# Patient Record
Sex: Female | Born: 1960
Health system: Southern US, Community
[De-identification: ages and names within clinical notes are randomized; demographics above are authoritative.]

## PROBLEM LIST (undated history)

## (undated) DIAGNOSIS — T7840XA Allergy, unspecified, initial encounter: Secondary | ICD-10-CM

## (undated) DIAGNOSIS — E785 Hyperlipidemia, unspecified: Secondary | ICD-10-CM

## (undated) DIAGNOSIS — G4733 Obstructive sleep apnea (adult) (pediatric): Secondary | ICD-10-CM

## (undated) DIAGNOSIS — M5136 Other intervertebral disc degeneration, lumbar region: Secondary | ICD-10-CM

## (undated) DIAGNOSIS — L309 Dermatitis, unspecified: Secondary | ICD-10-CM

## (undated) DIAGNOSIS — K649 Unspecified hemorrhoids: Secondary | ICD-10-CM

## (undated) DIAGNOSIS — M1990 Unspecified osteoarthritis, unspecified site: Secondary | ICD-10-CM

## (undated) DIAGNOSIS — K219 Gastro-esophageal reflux disease without esophagitis: Secondary | ICD-10-CM

## (undated) DIAGNOSIS — B009 Herpesviral infection, unspecified: Secondary | ICD-10-CM

## (undated) DIAGNOSIS — M51369 Other intervertebral disc degeneration, lumbar region without mention of lumbar back pain or lower extremity pain: Secondary | ICD-10-CM

## (undated) DIAGNOSIS — I10 Essential (primary) hypertension: Secondary | ICD-10-CM

## (undated) DIAGNOSIS — E559 Vitamin D deficiency, unspecified: Secondary | ICD-10-CM

## (undated) DIAGNOSIS — G4730 Sleep apnea, unspecified: Secondary | ICD-10-CM

## (undated) HISTORY — DX: Hyperlipidemia, unspecified: E78.5

## (undated) HISTORY — DX: Sleep apnea, unspecified: G47.30

## (undated) HISTORY — DX: Other intervertebral disc degeneration, lumbar region without mention of lumbar back pain or lower extremity pain: M51.369

## (undated) HISTORY — DX: Unspecified osteoarthritis, unspecified site: M19.90

## (undated) HISTORY — DX: Vitamin D deficiency, unspecified: E55.9

## (undated) HISTORY — DX: Herpesviral infection, unspecified: B00.9

## (undated) HISTORY — DX: Dermatitis, unspecified: L30.9

## (undated) HISTORY — DX: Obstructive sleep apnea (adult) (pediatric): G47.33

## (undated) HISTORY — DX: Essential (primary) hypertension: I10

## (undated) HISTORY — DX: Gastro-esophageal reflux disease without esophagitis: K21.9

## (undated) HISTORY — DX: Unspecified hemorrhoids: K64.9

## (undated) HISTORY — DX: Other intervertebral disc degeneration, lumbar region: M51.36

## (undated) HISTORY — DX: Allergy, unspecified, initial encounter: T78.40XA

---

## 1996-03-11 HISTORY — PX: DIAGNOSTIC LAPAROSCOPY: SUR761

## 1998-04-24 ENCOUNTER — Encounter: Payer: Self-pay | Admitting: Internal Medicine

## 1998-04-24 ENCOUNTER — Ambulatory Visit (HOSPITAL_COMMUNITY): Admission: RE | Admit: 1998-04-24 | Discharge: 1998-04-24 | Payer: Self-pay | Admitting: Internal Medicine

## 1998-09-28 ENCOUNTER — Ambulatory Visit (HOSPITAL_COMMUNITY): Admission: RE | Admit: 1998-09-28 | Discharge: 1998-09-28 | Payer: Self-pay | Admitting: *Deleted

## 1998-11-10 ENCOUNTER — Ambulatory Visit: Admission: RE | Admit: 1998-11-10 | Discharge: 1998-11-10 | Payer: Self-pay | Admitting: *Deleted

## 1999-01-30 ENCOUNTER — Other Ambulatory Visit: Admission: RE | Admit: 1999-01-30 | Discharge: 1999-01-30 | Payer: Self-pay | Admitting: Obstetrics

## 1999-10-22 ENCOUNTER — Ambulatory Visit (HOSPITAL_COMMUNITY): Admission: RE | Admit: 1999-10-22 | Discharge: 1999-10-22 | Payer: Self-pay | Admitting: *Deleted

## 2000-02-25 ENCOUNTER — Other Ambulatory Visit: Admission: RE | Admit: 2000-02-25 | Discharge: 2000-02-25 | Payer: Self-pay | Admitting: Obstetrics

## 2000-09-17 ENCOUNTER — Encounter: Payer: Self-pay | Admitting: Internal Medicine

## 2000-09-17 ENCOUNTER — Ambulatory Visit (HOSPITAL_COMMUNITY): Admission: RE | Admit: 2000-09-17 | Discharge: 2000-09-17 | Payer: Self-pay | Admitting: Internal Medicine

## 2000-11-06 ENCOUNTER — Ambulatory Visit (HOSPITAL_COMMUNITY): Admission: RE | Admit: 2000-11-06 | Discharge: 2000-11-06 | Payer: Self-pay | Admitting: Internal Medicine

## 2000-11-06 ENCOUNTER — Encounter: Payer: Self-pay | Admitting: Internal Medicine

## 2001-09-27 ENCOUNTER — Ambulatory Visit (HOSPITAL_BASED_OUTPATIENT_CLINIC_OR_DEPARTMENT_OTHER): Admission: RE | Admit: 2001-09-27 | Discharge: 2001-09-27 | Payer: Self-pay | Admitting: Internal Medicine

## 2001-11-13 ENCOUNTER — Other Ambulatory Visit: Admission: RE | Admit: 2001-11-13 | Discharge: 2001-11-13 | Payer: Self-pay | Admitting: *Deleted

## 2001-11-13 ENCOUNTER — Ambulatory Visit (HOSPITAL_COMMUNITY): Admission: RE | Admit: 2001-11-13 | Discharge: 2001-11-13 | Payer: Self-pay | Admitting: Internal Medicine

## 2001-11-13 ENCOUNTER — Encounter: Payer: Self-pay | Admitting: Internal Medicine

## 2002-01-20 ENCOUNTER — Ambulatory Visit (HOSPITAL_COMMUNITY): Admission: RE | Admit: 2002-01-20 | Discharge: 2002-01-20 | Payer: Self-pay | Admitting: Internal Medicine

## 2002-01-20 ENCOUNTER — Encounter: Payer: Self-pay | Admitting: Internal Medicine

## 2002-11-16 ENCOUNTER — Encounter: Payer: Self-pay | Admitting: Internal Medicine

## 2002-11-16 ENCOUNTER — Ambulatory Visit (HOSPITAL_COMMUNITY): Admission: RE | Admit: 2002-11-16 | Discharge: 2002-11-16 | Payer: Self-pay | Admitting: Internal Medicine

## 2002-11-29 ENCOUNTER — Other Ambulatory Visit: Admission: RE | Admit: 2002-11-29 | Discharge: 2002-11-29 | Payer: Self-pay | Admitting: *Deleted

## 2003-11-21 ENCOUNTER — Ambulatory Visit (HOSPITAL_COMMUNITY): Admission: RE | Admit: 2003-11-21 | Discharge: 2003-11-21 | Payer: Self-pay | Admitting: Internal Medicine

## 2004-12-06 ENCOUNTER — Ambulatory Visit (HOSPITAL_COMMUNITY): Admission: RE | Admit: 2004-12-06 | Discharge: 2004-12-06 | Payer: Self-pay | Admitting: Internal Medicine

## 2004-12-17 ENCOUNTER — Encounter: Admission: RE | Admit: 2004-12-17 | Discharge: 2004-12-17 | Payer: Self-pay | Admitting: Internal Medicine

## 2008-12-15 ENCOUNTER — Encounter: Admission: RE | Admit: 2008-12-15 | Discharge: 2008-12-15 | Payer: Self-pay | Admitting: Infectious Diseases

## 2009-12-20 ENCOUNTER — Encounter
Admission: RE | Admit: 2009-12-20 | Discharge: 2009-12-22 | Payer: Self-pay | Source: Home / Self Care | Attending: Internal Medicine | Admitting: Internal Medicine

## 2010-03-31 ENCOUNTER — Encounter: Payer: Self-pay | Admitting: Internal Medicine

## 2011-08-15 ENCOUNTER — Other Ambulatory Visit (HOSPITAL_COMMUNITY)
Admission: RE | Admit: 2011-08-15 | Discharge: 2011-08-15 | Disposition: A | Discharge status: Home / Self Care | Payer: Managed Care, Other (non HMO) | Source: Ambulatory Visit | Attending: Internal Medicine | Admitting: Internal Medicine

## 2011-08-15 DIAGNOSIS — Z01419 Encounter for gynecological examination (general) (routine) without abnormal findings: Secondary | ICD-10-CM | POA: Insufficient documentation

## 2011-10-02 ENCOUNTER — Encounter: Payer: Self-pay | Admitting: Gastroenterology

## 2012-06-15 ENCOUNTER — Other Ambulatory Visit: Payer: Self-pay

## 2012-06-15 DIAGNOSIS — Z1231 Encounter for screening mammogram for malignant neoplasm of breast: Secondary | ICD-10-CM

## 2012-06-29 ENCOUNTER — Ambulatory Visit
Admission: RE | Admit: 2012-06-29 | Discharge: 2012-06-29 | Disposition: A | Discharge status: Home / Self Care | Payer: Managed Care, Other (non HMO) | Source: Ambulatory Visit

## 2012-06-29 DIAGNOSIS — Z1231 Encounter for screening mammogram for malignant neoplasm of breast: Secondary | ICD-10-CM

## 2012-08-21 ENCOUNTER — Encounter: Payer: Self-pay | Admitting: Internal Medicine

## 2012-10-23 ENCOUNTER — Ambulatory Visit (AMBULATORY_SURGERY_CENTER): Payer: Managed Care, Other (non HMO) | Admitting: *Deleted

## 2012-10-23 VITALS — Ht 63.5 in | Wt 210.8 lb

## 2012-10-23 DIAGNOSIS — Z1211 Encounter for screening for malignant neoplasm of colon: Secondary | ICD-10-CM

## 2012-10-23 MED ORDER — MOVIPREP 100 G PO SOLR: ORAL | Status: DC

## 2012-10-23 NOTE — Progress Notes (Signed)
No allergies to eggs or soy. Pt says she had severe vomiting after getting home with laparoscopy in 1999

## 2012-10-26 ENCOUNTER — Encounter: Payer: Self-pay | Admitting: Internal Medicine

## 2012-11-06 ENCOUNTER — Ambulatory Visit (AMBULATORY_SURGERY_CENTER): Payer: Managed Care, Other (non HMO) | Admitting: Internal Medicine

## 2012-11-06 ENCOUNTER — Encounter: Payer: Self-pay | Admitting: Internal Medicine

## 2012-11-06 VITALS — BP 120/78 | HR 65 | Temp 99.3°F | Resp 16 | Ht 63.5 in | Wt 210.8 lb

## 2012-11-06 DIAGNOSIS — D126 Benign neoplasm of colon, unspecified: Secondary | ICD-10-CM

## 2012-11-06 DIAGNOSIS — Z1211 Encounter for screening for malignant neoplasm of colon: Secondary | ICD-10-CM

## 2012-11-06 MED ORDER — SODIUM CHLORIDE 0.9 % IV SOLN: 500.0000 mL | INTRAVENOUS | Status: DC

## 2012-11-06 NOTE — Op Note (Signed)
[facility] [address] [place], [address]   COLONOSCOPY PROCEDURE REPORT  PATIENT: Heather Walton, Heather Walton  MR#: [id number] BIRTHDATE: 09-21-1960 , 52  yrs. old GENDER: Female ENDOSCOPIST: Roxy Cedar, MD REFERRED [id number] Oneta Rack, M.D. PROCEDURE DATE:  11/06/2012 PROCEDURE:   Colonoscopy with snare polypectomy x 1 First Screening Colonoscopy - Avg.  risk and is 50 yrs.  old or older Yes.  Prior Negative Screening - Now for repeat screening. N/A  History of Adenoma - Now for follow-up colonoscopy & has been > or = to 3 yrs.  N/A  Polyps Removed Today? Yes. ASA CLASS:   Class II INDICATIONS:average risk screening. MEDICATIONS: MAC sedation, administered by CRNA and propofol (Diprivan) 230mg  IV  DESCRIPTION OF PROCEDURE:   After the risks benefits and alternatives of the procedure were thoroughly explained, informed consent was obtained.  A digital rectal exam revealed no abnormalities of the rectum.   The LB [id number]  endoscope was introduced through the anus and advanced to the cecum, which was identified by both the appendix and ileocecal valve. No adverse events experienced.   The quality of the prep was excellent, using MoviPrep  The instrument was then slowly withdrawn as the colon was fully examined.     COLON FINDINGS: A diminutive polyp was found in the ascending colon. A polypectomy was performed with a cold snare.  The resection was complete and the polyp tissue was completely retrieved.   The colon was otherwise normal.  There was no diverticulosis, inflammation, other polyps or cancers .  Retroflexed views revealed no abnormalities. The time to cecum=3 minutes 21 seconds.  Withdrawal time=11 minutes 04 seconds.  The scope was withdrawn and the procedure completed.  COMPLICATIONS: There were no complications.  ENDOSCOPIC IMPRESSION: 1.   Diminutive polyp was found in the ascending colon; polypectomy was performed with a cold  snare 2.   The colon was otherwise normal  RECOMMENDATIONS: 1. Repeat colonoscopy in 5 years if polyp adenomatous; otherwise 10 years   eSigned:  Roxy Cedar, MD 11/06/2012 10:47 AM   cc: Lucky Cowboy, MD and The Patient   PATIENT NAME:  [name], [name] MR#: [id number]

## 2012-11-06 NOTE — Progress Notes (Signed)
Patient did not experience any of the following events: a burn prior to discharge; a fall within the facility; wrong site/side/patient/procedure/implant event; or a hospital transfer or hospital admission upon discharge from the facility. (G8907) Patient did not have preoperative order for IV antibiotic SSI prophylaxis. (G8918)  

## 2012-11-06 NOTE — Progress Notes (Signed)
Called to room to assist during endoscopic procedure.  Patient ID and intended procedure confirmed with present staff. Received instructions for my participation in the procedure from the performing physician.  

## 2012-11-06 NOTE — Patient Instructions (Addendum)
YOU HAD AN ENDOSCOPIC PROCEDURE TODAY AT THE [facility]: Refer to the procedure report that was given to you for any specific questions about what was found during the examination.  If the procedure report does not answer your questions, please call your gastroenterologist to clarify.  If you requested that your care partner not be given the details of your procedure findings, then the procedure report has been included in a sealed envelope for you to review at your convenience later.  YOU SHOULD EXPECT: Some feelings of bloating in the abdomen. Passage of more gas than usual.  Walking can help get rid of the air that was put into your GI tract during the procedure and reduce the bloating. If you had a lower endoscopy (such as a colonoscopy or flexible sigmoidoscopy) you may notice spotting of blood in your stool or on the toilet paper. If you underwent a bowel prep for your procedure, then you may not have a normal bowel movement for a few days.  DIET: Your first meal following the procedure should be a light meal and then it is ok to progress to your normal diet.  A half-sandwich or bowl of soup is an example of a good first meal.  Heavy or fried foods are harder to digest and may make you feel nauseous or bloated.  Likewise meals heavy in dairy and vegetables can cause extra gas to form and this can also increase the bloating.  Drink plenty of fluids but you should avoid alcoholic beverages for 24 hours.  ACTIVITY: Your care partner should take you home directly after the procedure.  You should plan to take it easy, moving slowly for the rest of the day.  You can resume normal activity the day after the procedure however you should NOT DRIVE or use heavy machinery for 24 hours (because of the sedation medicines used during the test).    SYMPTOMS TO REPORT IMMEDIATELY: A gastroenterologist can be reached at any hour.  During normal business hours, 8:30 AM to 5:00 PM Monday through Friday,  call [phone].  After hours and on weekends, please call the GI answering service at [phone] who will take a message and have the physician on call contact you.   Following lower endoscopy (colonoscopy or flexible sigmoidoscopy):  Excessive amounts of blood in the stool  Significant tenderness or worsening of abdominal pains  Swelling of the abdomen that is new, acute  Fever of 100F or higher  FOLLOW UP: If any biopsies were taken you will be contacted by phone or by letter within the next 1-3 weeks.  Call your gastroenterologist if you have not heard about the biopsies in 3 weeks.  Our staff will call the home number listed on your records the next business day following your procedure to check on you and address any questions or concerns that you may have at that time regarding the information given to you following your procedure. This is a courtesy call and so if there is no answer at the home number and we have not heard from you through the emergency physician on call, we will assume that you have returned to your regular daily activities without incident.  SIGNATURES/CONFIDENTIALITY: You and/or your care partner have signed paperwork which will be entered into your electronic medical record.  These signatures attest to the fact that that the information above on your After Visit Summary has been reviewed and is understood.  Full responsibility of the confidentiality of this   discharge information lies with you and/or your care-partner.  Polyps-handout given  Repeat colonoscopy will be determined by pathology   

## 2012-11-06 NOTE — Progress Notes (Signed)
Procedure ends, to recovery, report given and VSS. 

## 2012-11-10 ENCOUNTER — Telehealth: Payer: Self-pay | Admitting: *Deleted

## 2012-11-10 NOTE — Telephone Encounter (Signed)
Bad connection with phone.  Started speaking with pt. ,was disconnected. Tried redialing.  Left message to call back if problems or questions relating to last weeks. Colonoscopy.

## 2012-11-13 ENCOUNTER — Encounter: Payer: Self-pay | Admitting: Internal Medicine

## 2013-01-26 ENCOUNTER — Ambulatory Visit: Payer: Self-pay | Admitting: *Deleted

## 2013-01-26 DIAGNOSIS — Z3049 Encounter for surveillance of other contraceptives: Secondary | ICD-10-CM

## 2013-01-26 MED ORDER — MEDROXYPROGESTERONE ACETATE 150 MG/ML IM SUSP
150.0000 mg | Freq: Once | INTRAMUSCULAR | Status: AC
  Administered 2013-01-26: 150 mg via INTRAMUSCULAR

## 2013-04-12 ENCOUNTER — Encounter: Payer: Self-pay | Admitting: Physician Assistant

## 2013-04-12 ENCOUNTER — Other Ambulatory Visit: Payer: Self-pay

## 2013-04-12 ENCOUNTER — Ambulatory Visit (INDEPENDENT_AMBULATORY_CARE_PROVIDER_SITE_OTHER): Payer: BC Managed Care – PPO | Admitting: Physician Assistant

## 2013-04-12 VITALS — BP 132/82 | HR 64 | Temp 98.6°F | Resp 16 | Ht 63.0 in | Wt 210.0 lb

## 2013-04-12 DIAGNOSIS — Z3049 Encounter for surveillance of other contraceptives: Secondary | ICD-10-CM

## 2013-04-12 DIAGNOSIS — H1089 Other conjunctivitis: Secondary | ICD-10-CM

## 2013-04-12 DIAGNOSIS — H109 Unspecified conjunctivitis: Secondary | ICD-10-CM

## 2013-04-12 DIAGNOSIS — Z1231 Encounter for screening mammogram for malignant neoplasm of breast: Secondary | ICD-10-CM

## 2013-04-12 MED ORDER — NEOMYCIN-POLYMYXIN-DEXAMETH 0.1 % OP SUSP
1.0000 [drp] | Freq: Four times a day (QID) | OPHTHALMIC | Status: AC
Start: 2013-04-12 — End: 2013-04-22

## 2013-04-12 MED ORDER — SULFAMETHOXAZOLE-TMP DS 800-160 MG PO TABS: 1.0000 | ORAL_TABLET | Freq: Two times a day (BID) | ORAL | Status: DC

## 2013-04-12 MED ORDER — MEDROXYPROGESTERONE ACETATE 150 MG/ML IM SUSP
150.0000 mg | Freq: Once | INTRAMUSCULAR | Status: AC
  Administered 2013-04-12: 150 mg via INTRAMUSCULAR

## 2013-04-12 NOTE — Patient Instructions (Signed)
Eye, Foreign Body A foreign body is an object that should not be there. The object could be near, on, or in the eye. HOME CARE If your doctor prescribes an eye patch:  Keep the eye patch on. Do this until you see your doctor again.  Do not remove the patch to put in medicine unless your doctor tells you.  Retape it as it was before:  When replacing the patch.  If the patch comes loose.  Do not drive or use machinery.  Only take medicine as told by your doctor. If your doctor does not prescribe an eye patch:  Keep the eye closed as much as possible.  Do not rub the eye.  Wear dark glasses in bright light.  Do not wear contact lenses until the eye feels normal, or as told by your doctor.  Wear protective eye covering, especially when using high speed tools.  Only take medicine as told by your doctor.  Conjunctivitis Do not wear your contacts for at least 3-5 days until the pain/irritation is better.  Take the bactrim if you get fever, chills, or sinus pain.  Conjunctivitis is commonly called "pink eye." Conjunctivitis can be caused by bacterial or viral infection, allergies, or injuries. There is usually redness of the lining of the eye, itching, discomfort, and sometimes discharge. There may be deposits of matter along the eyelids. A viral infection usually causes a watery discharge, while a bacterial infection causes a yellowish, thick discharge. Pink eye is very contagious and spreads by direct contact. You may be given antibiotic eyedrops as part of your treatment. Before using your eye medicine, remove all drainage from the eye by washing gently with warm water and cotton balls. Continue to use the medication until you have awakened 2 mornings in a row without discharge from the eye. Do not rub your eye. This increases the irritation and helps spread infection. Use separate towels from other household members. Wash your hands with soap and water before and after touching your  eyes. Use cold compresses to reduce pain and sunglasses to relieve irritation from light. Do not wear contact lenses or wear eye makeup until the infection is gone. SEEK MEDICAL CARE IF:   Your symptoms are not better after 3 days of treatment.  You have increased pain or trouble seeing.  The outer eyelids become very red or swollen. Document Released: 04/04/2004 Document Revised: 05/20/2011 Document Reviewed: 02/25/2005 [facility] Patient Information 2014 [place].

## 2013-04-12 NOTE — Progress Notes (Signed)
   Subjective:    Patient ID: Heather Walton, female    DOB: 03-18-1960, 53 y.o.   MRN: [id number]  Eye Problem  Affected eye: right greater than left.This is a new problem. The current episode started yesterday. The problem occurs constantly. The problem has been gradually worsening. The injury mechanism was contact lenses. There is no known exposure to pink eye. She wears contacts. Associated symptoms include blurred vision, an eye discharge, eye redness, a foreign body sensation and itching. Pertinent negatives include no double vision, fever, nausea, photophobia, recent URI or vomiting.    Review of Systems  Constitutional: Negative.  Negative for fever.  HENT: Negative.   Eyes: Positive for blurred vision, discharge, redness and itching. Negative for double vision, photophobia and pain.  Respiratory: Negative.   Cardiovascular: Negative.   Gastrointestinal: Negative.  Negative for nausea and vomiting.  Genitourinary: Negative.   Musculoskeletal: Negative.   Skin: Positive for itching.       Objective:   Physical Exam  Constitutional: She is oriented to person, place, and time. She appears well-developed and well-nourished.  HENT:  Head: Normocephalic and atraumatic.  Right Ear: External ear normal.  Left Ear: External ear normal.  Mouth/Throat: Oropharynx is clear and moist.  Eyes: EOM are normal. Pupils are equal, round, and reactive to light. Lids are everted and swept, no foreign bodies found. Right eye exhibits discharge. Right eye exhibits no hordeolum. No foreign body present in the right eye. Left eye exhibits no discharge and no hordeolum. No foreign body present in the left eye. Right conjunctiva is injected. Left conjunctiva is injected.  Neck: Normal range of motion. Neck supple. No thyromegaly present.  Cardiovascular: Normal rate, regular rhythm and normal heart sounds.  Exam reveals no gallop and no friction rub.   No murmur heard. Pulmonary/Chest: Effort normal and  breath sounds normal. No respiratory distress. She has no wheezes.  Abdominal: Soft. Bowel sounds are normal. She exhibits no distension and no mass. There is no tenderness. There is no rebound and no guarding.  Musculoskeletal: Normal range of motion.  Lymphadenopathy:    She has no cervical adenopathy.  Neurological: She is alert and oriented to person, place, and time. She displays normal reflexes. No cranial nerve deficit. Coordination normal.  Skin: Skin is warm and dry.  Psychiatric: She has a normal mood and affect.       Assessment & Plan:  Bacterial conjunctivitis - Plan: neomycin-polymyxin-dexamethasone (MAXITROL) 0.1 % ophthalmic suspension, sulfamethoxazole-trimethoprim (BACTRIM DS) 800-160 MG per tablet  Do not wear contacts for 1-2 weeks, call if worsening symptoms Surveillance of other previously prescribed contraceptive method - Plan: medroxyPROGESTERone (DEPO-PROVERA) injection 150 mg

## 2013-04-15 ENCOUNTER — Ambulatory Visit: Payer: Self-pay

## 2013-05-31 ENCOUNTER — Ambulatory Visit: Payer: Managed Care, Other (non HMO)

## 2013-07-01 ENCOUNTER — Ambulatory Visit (INDEPENDENT_AMBULATORY_CARE_PROVIDER_SITE_OTHER): Payer: BC Managed Care – PPO | Admitting: *Deleted

## 2013-07-01 ENCOUNTER — Ambulatory Visit
Admission: RE | Admit: 2013-07-01 | Discharge: 2013-07-01 | Disposition: A | Discharge status: Home / Self Care | Payer: Self-pay | Source: Ambulatory Visit

## 2013-07-01 DIAGNOSIS — Z1231 Encounter for screening mammogram for malignant neoplasm of breast: Secondary | ICD-10-CM

## 2013-07-01 DIAGNOSIS — Z3049 Encounter for surveillance of other contraceptives: Secondary | ICD-10-CM

## 2013-07-01 MED ORDER — MEDROXYPROGESTERONE ACETATE 150 MG/ML IM SUSP
150.0000 mg | Freq: Once | INTRAMUSCULAR | Status: AC
  Administered 2013-07-01: 150 mg via INTRAMUSCULAR

## 2013-08-19 ENCOUNTER — Encounter: Payer: Self-pay | Admitting: Emergency Medicine

## 2013-08-19 ENCOUNTER — Ambulatory Visit (INDEPENDENT_AMBULATORY_CARE_PROVIDER_SITE_OTHER): Payer: BC Managed Care – PPO | Admitting: Emergency Medicine

## 2013-08-19 VITALS — BP 124/82 | HR 62 | Temp 98.2°F | Resp 18 | Ht 63.25 in | Wt 212.0 lb

## 2013-08-19 DIAGNOSIS — E538 Deficiency of other specified B group vitamins: Secondary | ICD-10-CM

## 2013-08-19 DIAGNOSIS — Z111 Encounter for screening for respiratory tuberculosis: Secondary | ICD-10-CM

## 2013-08-19 DIAGNOSIS — G4733 Obstructive sleep apnea (adult) (pediatric): Secondary | ICD-10-CM

## 2013-08-19 DIAGNOSIS — Z79899 Other long term (current) drug therapy: Secondary | ICD-10-CM

## 2013-08-19 DIAGNOSIS — Z0000 Encounter for general adult medical examination without abnormal findings: Secondary | ICD-10-CM

## 2013-08-19 DIAGNOSIS — K219 Gastro-esophageal reflux disease without esophagitis: Secondary | ICD-10-CM

## 2013-08-19 DIAGNOSIS — E785 Hyperlipidemia, unspecified: Secondary | ICD-10-CM

## 2013-08-19 DIAGNOSIS — I10 Essential (primary) hypertension: Secondary | ICD-10-CM

## 2013-08-19 LAB — CBC WITH DIFFERENTIAL/PLATELET
BASOS PCT: 1 % (ref 0–1)
Basophils Absolute: 0.1 10*3/uL (ref 0.0–0.1)
EOS ABS: 0 10*3/uL (ref 0.0–0.7)
EOS PCT: 0 % (ref 0–5)
HEMATOCRIT: 41.1 % (ref 36.0–46.0)
HEMOGLOBIN: 14 g/dL (ref 12.0–15.0)
Lymphocytes Relative: 37 % (ref 12–46)
Lymphs Abs: 2.7 10*3/uL (ref 0.7–4.0)
MCH: 30 pg (ref 26.0–34.0)
MCHC: 34.1 g/dL (ref 30.0–36.0)
MCV: 88 fL (ref 78.0–100.0)
MONO ABS: 0.5 10*3/uL (ref 0.1–1.0)
MONOS PCT: 7 % (ref 3–12)
Neutro Abs: 4 10*3/uL (ref 1.7–7.7)
Neutrophils Relative %: 55 % (ref 43–77)
Platelets: 261 10*3/uL (ref 150–400)
RBC: 4.67 MIL/uL (ref 3.87–5.11)
RDW: 13.6 % (ref 11.5–15.5)
WBC: 7.3 10*3/uL (ref 4.0–10.5)

## 2013-08-19 LAB — HEMOGLOBIN A1C
Hgb A1c MFr Bld: 6.3 % — ABNORMAL HIGH (ref <5.7)
Mean Plasma Glucose: 134 mg/dL — ABNORMAL HIGH (ref <117)

## 2013-08-19 NOTE — Progress Notes (Signed)
Subjective:    Patient ID: Heather Walton, female    DOB: Oct 19, 1960, 53 y.o.   MRN: [id number]  HPI Comments: 53 yo AAF CPE. She did have elevated Cholesterol at last lab and did not f/u AD. She is eating healthy. She is exercising 4-5 times weekly outside of work.   She notes GERD is controlled with diet.      Medication List       This list is accurate as of: 08/19/13 11:59 PM.  Always use your most recent med list.               aspirin 81 MG tablet  Take 81 mg by mouth daily.     clobetasol cream 0.05 %  Commonly known as:  TEMOVATE     glucosamine-chondroitin 500-400 MG tablet  Take 1 tablet by mouth daily.     MAGNESIUM DR PO  Take 250 mg by mouth daily.     medroxyPROGESTERone 150 MG/ML injection  Commonly known as:  DEPO-PROVERA  Inject 150 mg into the muscle every 3 (three) months.     multivitamin tablet  Take 1 tablet by mouth daily.     vitamin C 1000 MG tablet  Take 1,000 mg by mouth daily.     Vitamin D-3 5000 UNITS Tabs  Take 5,000 Units by mouth daily.     vitamin E 400 UNIT capsule  Take 400 Units by mouth daily.       Allergies  Allergen Reactions  . Levaquin [Levofloxacin In D5w] Nausea Only  . Maxitrol [Neomycin-Polymyxin-Dexameth]     Distorted patient's vision for over a month  . Prilosec [Omeprazole] Rash   Past Medical History  Diagnosis Date  . Hemorrhoid   . Vitamin D deficiency   . Hyperlipidemia   . Hypertension   . GERD (gastroesophageal reflux disease)   . HSV-1 infection   . DDD (degenerative disc disease), lumbar   . OSA (obstructive sleep apnea)    Past Surgical History  Procedure Laterality Date  . Diagnostic laparoscopy  1998   History  Substance Use Topics  . Smoking status: Never Smoker   . Smokeless tobacco: Never Used  . Alcohol Use: No   Family History  Problem Relation Age of Onset  . Lung cancer Father   . Diabetes Maternal Grandmother   . Diabetes Maternal Grandfather   . Diabetes Paternal  Grandmother   . Diabetes Paternal Grandfather   . Hypertension Mother    MAINTENANCE: Colonoscopy: 11/06/12 DUE 2019 Due to polyps Mammo:07/01/13 BMD: n/a verify with gyn Pap/ Pelvic:08/2013 EYE: 2014 wnl Dentist:Q 6 month  IMMUNIZATIONS: Tdap:2011 Pneumovax: Zostavax: Influenza: 2014 @ [facility]  Patient Care Team: Unk Pinto, MD as PCP - General (Internal Medicine) Deneise Lever, MD as Consulting Physician (Pulmonary Disease) Irene Shipper, MD as Consulting Physician (Gastroenterology) Lavonna Monarch, MD as Consulting Physician (Dermatology) [place], Viola) Hildred Laser, (Dentist) Rosana Hoes, [facility])   Review of Systems  All other systems reviewed and are negative.  BP 124/82  Pulse 62  Temp(Src) 98.2 F (36.8 C) (Temporal)  Resp 18  Ht 5' 3.25" (1.607 m)  Wt 212 lb (96.163 kg)  BMI 37.24 kg/m2     Objective:   Physical Exam  Nursing note and vitals reviewed. Constitutional: She is oriented to person, place, and time. She appears well-developed and well-nourished. No distress.  HENT:  Head: Normocephalic and atraumatic.  Right Ear: External ear normal.  Left Ear: External ear normal.  Nose: Nose  normal.  Mouth/Throat: Oropharynx is clear and moist. No oropharyngeal exudate.  RIght ear canal with cerumen impaction  Eyes: Conjunctivae and EOM are normal. Pupils are equal, round, and reactive to light. Right eye exhibits no discharge. Left eye exhibits no discharge. No scleral icterus.  Neck: Normal range of motion. Neck supple. No JVD present. No tracheal deviation present. No thyromegaly present.  Cardiovascular: Normal rate, regular rhythm, normal heart sounds and intact distal pulses.   Pulmonary/Chest: Effort normal and breath sounds normal.  Abdominal: Soft. Bowel sounds are normal. She exhibits no distension and no mass. There is no tenderness. There is no rebound and no guarding.  Genitourinary:  Def gyn  Musculoskeletal: Normal range of motion. She exhibits no  edema and no tenderness.  Lymphadenopathy:    She has no cervical adenopathy.  Neurological: She is alert and oriented to person, place, and time. She has normal reflexes. No cranial nerve deficit. She exhibits normal muscle tone. Coordination normal.  Skin: Skin is warm and dry. No rash noted. No erythema. No pallor.  Mid low back blocked pore no erythema  Psychiatric: She has a normal mood and affect. Her behavior is normal. Judgment and thought content normal.       AORTA SCAN WNL EKG NSCSPT     Assessment & Plan:  1. CPE- Update screening labs/ History/ Immunizations/ Testing as needed. Advised healthy diet, QD exercise, increase H20 and continue RX/ Vitamins AD.  2. Cerumen impaction- OTC debrox/ oil AD, f/u ear lavage if no change  3. Blocked pore- f/u appointment for removal  4. Cholesterol- recheck labs, Need to eat healthier and exercise AD.

## 2013-08-19 NOTE — Patient Instructions (Signed)
Cholesterol Cholesterol is a type of fat. Your body needs a small amount of cholesterol, but too much can cause health problems. Certain problems include heart attacks, strokes, and not enough blood flow to your heart, brain, kidneys, or feet. You get cholesterol in 2 ways:  Naturally.  By eating certain foods. HOME CARE  Eat a low-fat diet:  Eat less eggs, whole dairy products (whole milk, cheese, and butter), fatty meats, and fried foods.  Eat more fruits, vegetables, whole-wheat breads, lean chicken, and fish.  Follow your exercise program as told by your doctor.  Keep your weight at a healthy level. Talk to your doctor about what is right for you.  Only take medicine as told by your doctor.  Get your cholesterol checked once a year or as told by your doctor. MAKE SURE YOU:  Understand these instructions.  Will watch your condition.  Will get help right away if you are not doing well or get worse. Document Released: 05/24/2008 Document Revised: 05/20/2011 Document Reviewed: 12/09/2012 [facility] Patient Information 2014 [place], [place]. Tuberculin Skin Test The PPD skin test is a method used to help with the diagnosis of a disease called tuberculosis (TB). HOW THE TEST IS DONE  The test site (usually the forearm) is cleansed. The PPD extract is then injected under the top layer of skin, causing a blister to form on the skin. The reaction will take 48 - 72 hours to develop. You must return to your health care provider within that time to have the area checked. This will determine whether you have had a significant reaction to the PPD test. A reaction is measured in millimeters of hard swelling (induration) at the site. PREPARATION FOR TEST  There is no special preparation for this test. People with a skin rash or other skin irritations on their arms may need to have the test performed at a different spot on the body. Tell your health care provider if you have ever had a positive  PPD skin test. If so, you should not have a repeat PPD test. Tell your doctor if you have a medical condition or if you take certain drugs, such as steroids, that can affect your immune system. These situations may lead to inaccurate test results. NORMAL FINDINGS A negative reaction (no induration) or a level of hard swelling that falls below a certain cutoff may mean that a person has not been infected with the bacteria that cause TB. There are different cutoffs for children, people with HIV, and other risk groups. Unfortunately, this is not a perfect test, and up to 20% of people infected with tuberculosis may not have a reaction on the PPD skin test. In addition, certain conditions that affect the immune system (cancer, recent chemotherapy, late-stage AIDS) may cause a false-negative test result.  The reaction will take 48 - 72 hours to develop. You must return to your health care provider within that time to have the area checked. Follow your caregiver's instructions as to where and when to report for this to be done. Ranges for normal findings may vary among different laboratories and hospitals. You should always check with your doctor after having lab work or other tests done to discuss the meaning of your test results and whether your values are considered within normal limits. WHAT ABNORMAL RESULTS MEAN  The results of the test depend on the size of the skin reaction and on the person being tested.  A small reaction (5 mm of hard swelling at the  site) is considered to be positive in people who have HIV, who are taking steroid therapy, or who have been in close contact with a person who has active tuberculosis. Larger reactions (greater than or equal to 10 mm) are considered positive in people with diabetes or kidney failure, and in health care workers, among others. In people with no known risks for tuberculosis, a positive reaction requires 15 mm or more of hard swelling at the site. RISKS AND  COMPLICATIONS There is a very small risk of severe redness and swelling of the arm in people who have had a previous positive PPD test and who have the test again. There also have been a few rare cases of this reaction in people who have not been tested before. CONSIDERATIONS  A positive skin test does not necessarily mean that a person has active tuberculosis. More tests will be done to check whether active disease is present. Many people who were born outside the [place] may have had a vaccine called "BCG," which can lead to a false-positive test result. MEANING OF TEST  Your caregiver will go over the test results with you and discuss the importance and meaning of your results, as well as treatment options and the need for additional tests if necessary. OBTAINING THE TEST RESULTS It is your responsibility to obtain your test results. Ask the lab or department performing the test when and how you will get your results. Document Released: 12/05/2004 Document Revised: 05/20/2011 Document Reviewed: 02/07/2008 [facility] Patient Information 2014 [place], [place].

## 2013-08-20 LAB — URINALYSIS, ROUTINE W REFLEX MICROSCOPIC
Bilirubin Urine: NEGATIVE
Glucose, UA: NEGATIVE mg/dL
Hgb urine dipstick: NEGATIVE
Ketones, ur: NEGATIVE mg/dL
LEUKOCYTES UA: NEGATIVE
NITRITE: NEGATIVE
PH: 7.5 (ref 5.0–8.0)
Protein, ur: NEGATIVE mg/dL
Urobilinogen, UA: 0.2 mg/dL (ref 0.0–1.0)

## 2013-08-20 LAB — BASIC METABOLIC PANEL WITH GFR
BUN: 12 mg/dL (ref 6–23)
CHLORIDE: 103 meq/L (ref 96–112)
CO2: 23 mEq/L (ref 19–32)
Calcium: 9.4 mg/dL (ref 8.4–10.5)
Creat: 1 mg/dL (ref 0.50–1.10)
GFR, Est African American: 75 mL/min
GFR, Est Non African American: 65 mL/min
GLUCOSE: 96 mg/dL (ref 70–99)
POTASSIUM: 4 meq/L (ref 3.5–5.3)
SODIUM: 136 meq/L (ref 135–145)

## 2013-08-20 LAB — VITAMIN D 25 HYDROXY (VIT D DEFICIENCY, FRACTURES): Vit D, 25-Hydroxy: 69 ng/mL (ref 30–89)

## 2013-08-20 LAB — LIPID PANEL
CHOLESTEROL: 144 mg/dL (ref 0–200)
HDL: 54 mg/dL (ref >39)
LDL Cholesterol: 71 mg/dL (ref 0–99)
TRIGLYCERIDES: 96 mg/dL (ref <150)
Total CHOL/HDL Ratio: 2.7 Ratio
VLDL: 19 mg/dL (ref 0–40)

## 2013-08-20 LAB — HEPATIC FUNCTION PANEL
ALT: 18 U/L (ref 0–35)
AST: 21 U/L (ref 0–37)
Albumin: 4.5 g/dL (ref 3.5–5.2)
Alkaline Phosphatase: 50 U/L (ref 39–117)
BILIRUBIN DIRECT: 0.1 mg/dL (ref 0.0–0.3)
BILIRUBIN INDIRECT: 0.5 mg/dL (ref 0.2–1.2)
BILIRUBIN TOTAL: 0.6 mg/dL (ref 0.2–1.2)
TOTAL PROTEIN: 7.3 g/dL (ref 6.0–8.3)

## 2013-08-20 LAB — MICROALBUMIN / CREATININE URINE RATIO
Creatinine, Urine: 25.4 mg/dL
MICROALB UR: 0.5 mg/dL (ref 0.00–1.89)
MICROALB/CREAT RATIO: 19.7 mg/g (ref 0.0–30.0)

## 2013-08-20 LAB — MAGNESIUM: Magnesium: 2 mg/dL (ref 1.5–2.5)

## 2013-08-20 LAB — INSULIN, FASTING: Insulin fasting, serum: 11 u[IU]/mL (ref 3–28)

## 2013-08-20 LAB — TSH: TSH: 0.584 u[IU]/mL (ref 0.350–4.500)

## 2013-08-23 DIAGNOSIS — G4733 Obstructive sleep apnea (adult) (pediatric): Secondary | ICD-10-CM

## 2013-08-23 DIAGNOSIS — I10 Essential (primary) hypertension: Secondary | ICD-10-CM | POA: Insufficient documentation

## 2013-08-23 DIAGNOSIS — K219 Gastro-esophageal reflux disease without esophagitis: Secondary | ICD-10-CM

## 2013-08-23 DIAGNOSIS — E785 Hyperlipidemia, unspecified: Secondary | ICD-10-CM | POA: Insufficient documentation

## 2013-08-23 LAB — TB SKIN TEST
INDURATION: 0 mm
TB Skin Test: NEGATIVE

## 2013-09-09 ENCOUNTER — Ambulatory Visit: Payer: Self-pay

## 2013-09-09 ENCOUNTER — Ambulatory Visit (INDEPENDENT_AMBULATORY_CARE_PROVIDER_SITE_OTHER): Payer: BC Managed Care – PPO | Admitting: Physician Assistant

## 2013-09-09 ENCOUNTER — Encounter: Payer: Self-pay | Admitting: Physician Assistant

## 2013-09-09 VITALS — BP 110/70 | HR 72 | Temp 98.1°F | Resp 16 | Ht 63.0 in | Wt 208.0 lb

## 2013-09-09 DIAGNOSIS — L723 Sebaceous cyst: Secondary | ICD-10-CM

## 2013-09-09 DIAGNOSIS — H6120 Impacted cerumen, unspecified ear: Secondary | ICD-10-CM

## 2013-09-09 DIAGNOSIS — Z3049 Encounter for surveillance of other contraceptives: Secondary | ICD-10-CM

## 2013-09-09 DIAGNOSIS — H6121 Impacted cerumen, right ear: Secondary | ICD-10-CM

## 2013-09-09 MED ORDER — MEDROXYPROGESTERONE ACETATE 150 MG/ML IM SUSP
150.0000 mg | Freq: Once | INTRAMUSCULAR | Status: AC
  Administered 2013-09-09: 150 mg via INTRAMUSCULAR

## 2013-09-09 NOTE — Progress Notes (Signed)
   Subjective:    Patient ID: Heather Walton, female    DOB: [birth year], 53 y.o.   MRN: [id number]  HPI 53 y.o. female presents with cyst on back, and want right ear checked. She was recently seen in the office and told she had cerumen impaction in her right ear, she went home and put oil in her ear and states it is better but wants it checked, no pain, hearing normal. She has had a cyst on her lower back, not infected and wants it removed.    Review of Systems  Constitutional: Negative.  Negative for fever.  HENT: Positive for ear pain (ear fullness right ear) and hearing loss. Negative for congestion, dental problem, drooling, ear discharge, facial swelling, mouth sores, nosebleeds, postnasal drip and rhinorrhea.   Eyes: Negative.   Respiratory: Negative.   Cardiovascular: Negative.   Gastrointestinal: Negative.   Genitourinary: Negative.   Musculoskeletal: Negative.   Skin:       Area on left lower back  Neurological: Negative.        Objective:   Physical Exam  Constitutional: She appears well-developed and well-nourished.  HENT:  Head: Normocephalic and atraumatic.  Right ear with cerumen impaction, after removal slight erythema of external canal, TM intact, no bulging  Eyes: Conjunctivae are normal. Pupils are equal, round, and reactive to light.  Neck: Normal range of motion. Neck supple.  Cardiovascular: Normal rate and regular rhythm.   Pulmonary/Chest: Effort normal and breath sounds normal.  Abdominal: Soft. Bowel sounds are normal.  Musculoskeletal: Normal range of motion.  Skin:  Left lower back with 3x3cm black head, tender          Assessment & Plan:  Depo Shot Cerumen impaction right ear- removal Seb cyst- removal with packing, instructions on what do do  Area was cleaned with alcohol, 1 cc lidocaine was used to numb area, 11 blade used to do I&D, black and white thick material removed, area packed and instructions given.

## 2013-11-18 ENCOUNTER — Ambulatory Visit: Payer: Self-pay

## 2013-12-01 ENCOUNTER — Ambulatory Visit (INDEPENDENT_AMBULATORY_CARE_PROVIDER_SITE_OTHER): Payer: BC Managed Care – PPO | Admitting: Physician Assistant

## 2013-12-01 ENCOUNTER — Encounter: Payer: Self-pay | Admitting: Physician Assistant

## 2013-12-01 VITALS — BP 122/78 | HR 68 | Temp 97.9°F | Resp 16 | Ht 63.0 in | Wt 211.0 lb

## 2013-12-01 DIAGNOSIS — K219 Gastro-esophageal reflux disease without esophagitis: Secondary | ICD-10-CM

## 2013-12-01 DIAGNOSIS — R7309 Other abnormal glucose: Secondary | ICD-10-CM

## 2013-12-01 DIAGNOSIS — E669 Obesity, unspecified: Secondary | ICD-10-CM

## 2013-12-01 DIAGNOSIS — G4733 Obstructive sleep apnea (adult) (pediatric): Secondary | ICD-10-CM

## 2013-12-01 DIAGNOSIS — I10 Essential (primary) hypertension: Secondary | ICD-10-CM

## 2013-12-01 DIAGNOSIS — E785 Hyperlipidemia, unspecified: Secondary | ICD-10-CM

## 2013-12-01 DIAGNOSIS — R7303 Prediabetes: Secondary | ICD-10-CM

## 2013-12-01 NOTE — Progress Notes (Signed)
Assessment and Plan:  Hypertension: Continue medication, monitor blood pressure at home. Continue DASH diet. Cholesterol: Continue diet and exercise.  Pre-diabetes-Continue diet and exercise. Check A1C Vitamin D Def- check level and continue medications.  Obesity with co morbidities- long discussion about weight loss, diet, and exercise   Continue diet and meds as discussed. Further disposition pending results of labs.  HPI 53 y.o. female  presents for 3 month follow up with hypertension, hyperlipidemia, prediabetes and vitamin D. Her blood pressure has been controlled at home, today their BP is BP: 122/78 mmHg She does workout. She denies chest pain, shortness of breath, dizziness.  She is not on cholesterol medication and denies myalgias. Her cholesterol is at goal. The cholesterol last visit was:   Lab Results  Component Value Date   CHOL 144 08/19/2013   HDL 54 08/19/2013   LDLCALC 71 08/19/2013   TRIG 96 08/19/2013   CHOLHDL 2.7 08/19/2013   She has been working on diet and exercise for prediabetes, she is eating very well, and denies paresthesia of the feet, polydipsia and polyuria. Last A1C in the office was:  Lab Results  Component Value Date   HGBA1C 6.3* 08/19/2013   Patient is on Vitamin D supplement.   Lab Results  Component Value Date   VD25OH 69 08/19/2013     BMI is Body mass index is 37.39 kg/(m^2)., Wt Readings from Last 3 Encounters:  12/01/13 211 lb (95.709 kg)  09/09/13 208 lb (94.348 kg)  08/19/13 212 lb (96.163 kg)    Current Medications:  Current Outpatient Prescriptions on File Prior to Visit  Medication Sig Dispense Refill  . Ascorbic Acid (VITAMIN C) 1000 MG tablet Take 1,000 mg by mouth daily.      Marland Kitchen aspirin 81 MG tablet Take 81 mg by mouth daily.      . Cholecalciferol (VITAMIN D-3) 5000 UNITS TABS Take 5,000 Units by mouth daily.      . clobetasol cream (TEMOVATE) 0.05 %       . glucosamine-chondroitin 500-400 MG tablet Take 1 tablet by mouth  daily.      . Magnesium Chloride (MAGNESIUM DR PO) Take 250 mg by mouth daily.       . Multiple Vitamin (MULTIVITAMIN) tablet Take 1 tablet by mouth daily.      . vitamin E 400 UNIT capsule Take 400 Units by mouth daily.       No current facility-administered medications on file prior to visit.   Medical History:  Past Medical History  Diagnosis Date  . Hemorrhoid   . Vitamin D deficiency   . Hyperlipidemia   . Hypertension   . GERD (gastroesophageal reflux disease)   . HSV-1 infection   . DDD (degenerative disc disease), lumbar   . OSA (obstructive sleep apnea)    Allergies:  Allergies  Allergen Reactions  . Levaquin [Levofloxacin In D5w] Nausea Only  . Maxitrol [Neomycin-Polymyxin-Dexameth]     Distorted patient's vision for over a month  . Prilosec [Omeprazole] Rash     Review of Systems: [X]  = complains of  [ ]  = denies  General: Fatigue [ ]  Fever [ ]  Chills [ ]  Weakness [ ]   Insomnia [ ]  Eyes: Redness [ ]  Blurred vision [ ]  Diplopia [ ]   ENT: Congestion [ ]  Sinus Pain [ ]  Post Nasal Drip [ ]  Sore Throat [ ]  Earache [ ]   Cardiac: Chest pain/pressure [ ]  SOB [ ]  Orthopnea [ ]   Palpitations [ ]   Paroxysmal  nocturnal dyspnea[ ]  Claudication [ ]  Edema [ ]   Pulmonary: Cough [ ]  Wheezing[ ]   SOB [ ]   Snoring [ ]   GI: Nausea [ ]  Vomiting[ ]  Dysphagia[ ]  Heartburn[ ]  Abdominal pain [ ]  Constipation [ ] ; Diarrhea [ ] ; BRBPR [ ]  Melena[ ]  GU: Hematuria[ ]  Dysuria [ ]  Nocturia[ ]  Urgency [ ]   Hesitancy [ ]  Discharge [ ]  Neuro: Headaches[ ]  Vertigo[ ]  Paresthesias[ ]  Spasm [ ]  Speech changes [ ]  Incoordination [ ]   Ortho: Arthritis [ ]  Joint pain [ ]  Muscle pain [ ]  Joint swelling [ ]  Back Pain [ ]  Skin:  Rash [ ]   Pruritis [ ]  Change in skin lesion [ ]   Psych: Depression[ ]  Anxiety[ ]  Confusion [ ]  Memory loss [ ]   Heme/Lypmh: Bleeding [ ]  Bruising [ ]  Enlarged lymph nodes [ ]   Endocrine: Visual blurring [ ]  Paresthesia [ ]  Polyuria [ ]  Polydypsea [ ]    Heat/cold intolerance [ ]   Hypoglycemia [ ]   Family history- Review and unchanged Social history- Review and unchanged Physical Exam: BP 122/78  Pulse 68  Temp(Src) 97.9 F (36.6 C)  Resp 16  Ht 5\' 3"  (1.6 m)  Wt 211 lb (95.709 kg)  BMI 37.39 kg/m2 Wt Readings from Last 3 Encounters:  12/01/13 211 lb (95.709 kg)  09/09/13 208 lb (94.348 kg)  08/19/13 212 lb (96.163 kg)   General Appearance: Well nourished, in no apparent distress. Eyes: PERRLA, EOMs, conjunctiva no swelling or erythema Sinuses: No Frontal/maxillary tenderness ENT/Mouth: Ext aud canals clear, TMs without erythema, bulging. No erythema, swelling, or exudate on post pharynx.  Tonsils not swollen or erythematous. Hearing normal.  Neck: Supple, thyroid normal.  Respiratory: Respiratory effort normal, BS equal bilaterally without rales, rhonchi, wheezing or stridor.  Cardio: RRR with no MRGs. Brisk peripheral pulses without edema.  Abdomen: Soft, + BS.  Non tender, no guarding, rebound, hernias, masses. Lymphatics: Non tender without lymphadenopathy.  Musculoskeletal: Full ROM, 5/5 strength, normal gait.  Skin: Warm, dry without rashes, lesions, ecchymosis.  Neuro: Cranial nerves intact. Normal muscle tone, no cerebellar symptoms. Sensation intact.  Psych: Awake and oriented X 3, normal affect, Insight and Judgment appropriate.    Vicie Mutters 4:56 PM

## 2013-12-01 NOTE — Patient Instructions (Signed)
Your A1C is a measure of your sugar over the past 3 months and is not affected by what you have eaten over the past few days. Diabetes increases your chances of stroke and heart attack over 300 % and is the leading cause of blindness and kidney failure in the [place]. Please make sure you decrease bad carbs like white bread, white rice, potatoes, corn, soft drinks, pasta, cereals, refined sugars, sweet tea, dried fruits, and fruit juice. Good carbs are okay to eat in moderation like sweet potatoes, brown rice, whole grain pasta/bread, most fruit (except dried fruit) and you can eat as many veggies as you want.   Greater than 6.5 is considered diabetic. Between 6.4 and 5.7 is prediabetic If your A1C is less than 5.7 you are NOT diabetic.    Bad carbs also include fruit juice, alcohol, and sweet tea. These are empty calories that do not signal to your brain that you are full.   Please remember the good carbs are still carbs which convert into sugar. So please measure them out no more than 1/2-1 cup of rice, oatmeal, pasta, and beans.  Veggies are however free foods! Pile them on.   I like lean protein at every meal such as chicken, [place], pork chops, cottage cheese, etc. Just do not fry these meats and please center your meal around vegetable, the meats should be a side dish.   No all fruit is created equal. Please see the list below, the fruit at the bottom is higher in sugars than the fruit at the top

## 2013-12-02 LAB — COMPREHENSIVE METABOLIC PANEL
ALBUMIN: 4.3 g/dL (ref 3.5–5.2)
ALT: 17 U/L (ref 0–35)
AST: 18 U/L (ref 0–37)
Alkaline Phosphatase: 50 U/L (ref 39–117)
BUN: 14 mg/dL (ref 6–23)
CALCIUM: 9.8 mg/dL (ref 8.4–10.5)
CHLORIDE: 107 meq/L (ref 96–112)
CO2: 24 meq/L (ref 19–32)
CREATININE: 0.92 mg/dL (ref 0.50–1.10)
Glucose, Bld: 94 mg/dL (ref 70–99)
POTASSIUM: 4.3 meq/L (ref 3.5–5.3)
Sodium: 137 mEq/L (ref 135–145)
Total Bilirubin: 0.4 mg/dL (ref 0.2–1.2)
Total Protein: 7.5 g/dL (ref 6.0–8.3)

## 2013-12-02 LAB — HEMOGLOBIN A1C
HEMOGLOBIN A1C: 6.3 % — AB (ref <5.7)
Mean Plasma Glucose: 134 mg/dL — ABNORMAL HIGH (ref <117)

## 2013-12-02 LAB — INSULIN, FASTING: Insulin fasting, serum: 11.3 u[IU]/mL (ref 2.0–19.6)

## 2014-02-23 ENCOUNTER — Ambulatory Visit (INDEPENDENT_AMBULATORY_CARE_PROVIDER_SITE_OTHER): Payer: BC Managed Care – PPO | Admitting: Physician Assistant

## 2014-02-23 ENCOUNTER — Encounter: Payer: Self-pay | Admitting: Physician Assistant

## 2014-02-23 VITALS — BP 138/92 | HR 70 | Temp 98.2°F | Resp 16 | Ht 63.5 in | Wt 210.0 lb

## 2014-02-23 DIAGNOSIS — H65193 Other acute nonsuppurative otitis media, bilateral: Secondary | ICD-10-CM

## 2014-02-23 MED ORDER — AMOXICILLIN-POT CLAVULANATE 875-125 MG PO TABS
1.0000 | ORAL_TABLET | Freq: Two times a day (BID) | ORAL | Status: DC
Start: 2014-02-23 — End: 2014-08-25

## 2014-02-23 NOTE — Patient Instructions (Addendum)
-  Take Augmentin as prescribed with food. -Take Tylenol, Ibuprofen, Advil or Aleve.  (Choose one) for headache.  If you are not feeling better in 10-14 days, then please call the office.  Otitis Media Otitis media is redness, soreness, and inflammation of the middle ear. Otitis media may be caused by allergies or, most commonly, by infection. Often it occurs as a complication of the common cold. SIGNS AND SYMPTOMS Symptoms of otitis media may include:  Earache.  Fever.  Ringing in your ear.  Headache.  Leakage of fluid from the ear. DIAGNOSIS To diagnose otitis media, your health care provider will examine your ear with an otoscope. This is an instrument that allows your health care provider to see into your ear in order to examine your eardrum. Your health care provider also will ask you questions about your symptoms. TREATMENT  Typically, otitis media resolves on its own within 3-5 days. Your health care provider may prescribe medicine to ease your symptoms of pain. If otitis media does not resolve within 5 days or is recurrent, your health care provider may prescribe antibiotic medicines if he or she suspects that a bacterial infection is the cause. HOME CARE INSTRUCTIONS   If you were prescribed an antibiotic medicine, finish it all even if you start to feel better.  Take medicines only as directed by your health care provider.  Keep all follow-up visits as directed by your health care provider. SEEK MEDICAL CARE IF:  You have otitis media only in one ear, or bleeding from your nose, or both.  You notice a lump on your neck.  You are not getting better in 3-5 days.  You feel worse instead of better. SEEK IMMEDIATE MEDICAL CARE IF:   You have pain that is not controlled with medicine.  You have swelling, redness, or pain around your ear or stiffness in your neck.  You notice that part of your face is paralyzed.  You notice that the bone behind your ear (mastoid) is  tender when you touch it. MAKE SURE YOU:   Understand these instructions.  Will watch your condition.  Will get help right away if you are not doing well or get worse. Document Released: 12/01/2003 Document Revised: 07/12/2013 Document Reviewed: 09/22/2012 [facility] Patient Information 2015 [place], [place]. This information is not intended to replace advice given to you by your health care provider. Make sure you discuss any questions you have with your health care provider.

## 2014-02-23 NOTE — Progress Notes (Signed)
Subjective:    Patient ID: Heather Walton, female    DOB: 04-Dec-1960, 53 y.o.   MRN: [id number]  Otalgia  Affected ear: little bit in left and bad in right. This is a new problem. Episode onset: 1 day. The problem occurs constantly. The pain is at a severity of 4/10. Associated symptoms include headaches. Pertinent negatives include no ear discharge, rash, rhinorrhea or sore throat. She has tried nothing for the symptoms.  Headache  This is a new problem. Episode onset: 1 hour ago. The problem occurs constantly. The pain is located in the frontal region. The quality of the pain is described as dull. The pain is at a severity of 4/10. Associated symptoms include ear pain and sinus pressure. Pertinent negatives include no dizziness, phonophobia, photophobia, rhinorrhea or sore throat. She has tried nothing for the symptoms.  Never Smoked GFR= 75 on 08/19/13 Review of Systems  Constitutional: Negative.   HENT: Positive for congestion, ear pain and sinus pressure. Negative for ear discharge, postnasal drip, rhinorrhea, sore throat, trouble swallowing and voice change.   Eyes: Negative for photophobia.  Respiratory: Negative.   Cardiovascular: Negative.   Gastrointestinal: Negative.   Genitourinary: Negative.   Skin: Negative.  Negative for rash.  Neurological: Positive for headaches. Negative for dizziness and light-headedness.   Past Medical History  Diagnosis Date  . Hemorrhoid   . Vitamin D deficiency   . Hyperlipidemia   . Hypertension   . GERD (gastroesophageal reflux disease)   . HSV-1 infection   . DDD (degenerative disc disease), lumbar   . OSA (obstructive sleep apnea)    Current Outpatient Prescriptions on File Prior to Visit  Medication Sig Dispense Refill  . Ascorbic Acid (VITAMIN C) 1000 MG tablet Take 1,000 mg by mouth daily.    Marland Kitchen aspirin 81 MG tablet Take 81 mg by mouth daily.    . Cholecalciferol (VITAMIN D-3) 5000 UNITS TABS Take 5,000 Units by mouth daily.    .  clobetasol cream (TEMOVATE) 0.05 %     . glucosamine-chondroitin 500-400 MG tablet Take 1 tablet by mouth daily.    . Magnesium Chloride (MAGNESIUM DR PO) Take 250 mg by mouth daily.     . Multiple Vitamin (MULTIVITAMIN) tablet Take 1 tablet by mouth daily.    . vitamin E 400 UNIT capsule Take 400 Units by mouth daily.     No current facility-administered medications on file prior to visit.   Allergies  Allergen Reactions  . Levaquin [Levofloxacin In D5w] Nausea Only  . Maxitrol [Neomycin-Polymyxin-Dexameth]     Distorted patient's vision for over a month  . Prilosec [Omeprazole] Rash     BP 138/92 mmHg  Pulse 70  Temp(Src) 98.2 F (36.8 C) (Temporal)  Resp 16  Ht 5' 3.5" (1.613 m)  Wt 210 lb (95.255 kg)  BMI 36.61 kg/m2  SpO2 98% Wt Readings from Last 3 Encounters:  02/23/14 210 lb (95.255 kg)  12/01/13 211 lb (95.709 kg)  09/09/13 208 lb (94.348 kg)   Objective:   Physical Exam  Constitutional: She is oriented to person, place, and time. She appears well-developed and well-nourished. She has a sickly appearance. No distress.  HENT:  Head: Normocephalic.  Right Ear: External ear and ear canal normal. Tympanic membrane is injected, erythematous and bulging. Tympanic membrane is not scarred, not perforated and not retracted.  Left Ear: External ear and ear canal normal. Tympanic membrane is injected, erythematous and bulging. Tympanic membrane is not scarred, not perforated and not  retracted.  Nose: No mucosal edema or rhinorrhea. Right sinus exhibits frontal sinus tenderness. Right sinus exhibits no maxillary sinus tenderness. Left sinus exhibits frontal sinus tenderness. Left sinus exhibits no maxillary sinus tenderness.  Mouth/Throat: Uvula is midline, oropharynx is clear and moist and mucous membranes are normal. Mucous membranes are not pale and not dry. No trismus in the jaw. No uvula swelling. No oropharyngeal exudate, posterior oropharyngeal edema, posterior oropharyngeal  erythema or tonsillar abscesses.  Eyes: Conjunctivae and lids are normal. Pupils are equal, round, and reactive to light. Right eye exhibits no discharge. Left eye exhibits no discharge. No scleral icterus.  Neck: Trachea normal, normal range of motion and phonation normal. Neck supple. No tracheal tenderness present. No tracheal deviation present.  Cardiovascular: Normal rate, regular rhythm, S1 normal, S2 normal and normal pulses.  Exam reveals no gallop, no distant heart sounds and no friction rub.   Murmur heard.  Systolic murmur is present with a grade of 1/6  Murmur heard only on right upper sternal border.  Pulmonary/Chest: Effort normal and breath sounds normal. No stridor. No respiratory distress. She has no decreased breath sounds. She has no wheezes. She has no rhonchi. She has no rales. She exhibits no tenderness.  Abdominal: Soft. Bowel sounds are normal.  Lymphadenopathy:  No tenderness or LAD.  Neurological: She is alert and oriented to person, place, and time. No cranial nerve deficit. Gait normal.  Skin: Skin is warm, dry and intact. No rash noted. She is not diaphoretic.  Psychiatric: She has a normal mood and affect. Her speech is normal and behavior is normal. Judgment and thought content normal. Cognition and memory are normal.  Vitals reviewed.  Assessment & Plan:  1. Acute nonsuppurative otitis media of both ears Take Augmentin as prescribed with food- amoxicillin-clavulanate (AUGMENTIN) 875-125 MG per tablet; Take 1 tablet by mouth 2 (two) times daily. For 10 days  Dispense: 20 tablet; Refill: 0  Discussed medication effects and SE's.  Pt agreed to treatment plan. If you are not feeling better in 10-14 days, then please call the office. Please schedule appt for regular follow up in March 2016.  Gabriela Giannelli, Stephani Police, PA-C 4:23 PM [facility]

## 2014-03-02 ENCOUNTER — Encounter: Payer: Self-pay | Admitting: Internal Medicine

## 2014-03-02 DIAGNOSIS — H65193 Other acute nonsuppurative otitis media, bilateral: Secondary | ICD-10-CM

## 2014-03-07 MED ORDER — CEFUROXIME AXETIL 500 MG PO TABS: 500.0000 mg | ORAL_TABLET | Freq: Two times a day (BID) | ORAL | Status: DC

## 2014-05-30 ENCOUNTER — Other Ambulatory Visit: Payer: Self-pay

## 2014-05-30 DIAGNOSIS — Z9289 Personal history of other medical treatment: Secondary | ICD-10-CM

## 2014-06-09 ENCOUNTER — Ambulatory Visit: Payer: Self-pay | Admitting: Physician Assistant

## 2014-07-07 ENCOUNTER — Other Ambulatory Visit: Payer: Self-pay

## 2014-07-07 ENCOUNTER — Ambulatory Visit
Admission: RE | Admit: 2014-07-07 | Discharge: 2014-07-07 | Disposition: A | Discharge status: Home / Self Care | Payer: BLUE CROSS/BLUE SHIELD | Source: Ambulatory Visit

## 2014-07-07 DIAGNOSIS — Z1231 Encounter for screening mammogram for malignant neoplasm of breast: Secondary | ICD-10-CM

## 2014-07-07 DIAGNOSIS — Z9289 Personal history of other medical treatment: Secondary | ICD-10-CM

## 2014-08-25 ENCOUNTER — Encounter: Payer: Self-pay | Admitting: Internal Medicine

## 2014-08-25 ENCOUNTER — Ambulatory Visit (INDEPENDENT_AMBULATORY_CARE_PROVIDER_SITE_OTHER): Payer: BLUE CROSS/BLUE SHIELD | Admitting: Internal Medicine

## 2014-08-25 ENCOUNTER — Encounter: Payer: Self-pay | Admitting: Emergency Medicine

## 2014-08-25 VITALS — BP 126/80 | HR 76 | Temp 98.2°F | Resp 16 | Ht 63.25 in | Wt 202.0 lb

## 2014-08-25 DIAGNOSIS — I10 Essential (primary) hypertension: Secondary | ICD-10-CM

## 2014-08-25 DIAGNOSIS — E785 Hyperlipidemia, unspecified: Secondary | ICD-10-CM

## 2014-08-25 DIAGNOSIS — R6889 Other general symptoms and signs: Secondary | ICD-10-CM

## 2014-08-25 DIAGNOSIS — E669 Obesity, unspecified: Secondary | ICD-10-CM

## 2014-08-25 DIAGNOSIS — Z0001 Encounter for general adult medical examination with abnormal findings: Secondary | ICD-10-CM

## 2014-08-25 DIAGNOSIS — Z79899 Other long term (current) drug therapy: Secondary | ICD-10-CM

## 2014-08-25 DIAGNOSIS — E559 Vitamin D deficiency, unspecified: Secondary | ICD-10-CM

## 2014-08-25 DIAGNOSIS — Z131 Encounter for screening for diabetes mellitus: Secondary | ICD-10-CM

## 2014-08-25 LAB — HEPATIC FUNCTION PANEL
ALK PHOS: 58 U/L (ref 39–117)
ALT: 40 U/L — AB (ref 0–35)
AST: 29 U/L (ref 0–37)
Albumin: 4.1 g/dL (ref 3.5–5.2)
BILIRUBIN DIRECT: 0.1 mg/dL (ref 0.0–0.3)
BILIRUBIN INDIRECT: 0.3 mg/dL (ref 0.2–1.2)
BILIRUBIN TOTAL: 0.4 mg/dL (ref 0.2–1.2)
Total Protein: 7.3 g/dL (ref 6.0–8.3)

## 2014-08-25 LAB — BASIC METABOLIC PANEL WITH GFR
BUN: 11 mg/dL (ref 6–23)
CALCIUM: 9.8 mg/dL (ref 8.4–10.5)
CO2: 23 meq/L (ref 19–32)
Chloride: 106 mEq/L (ref 96–112)
Creat: 0.91 mg/dL (ref 0.50–1.10)
GFR, EST AFRICAN AMERICAN: 83 mL/min
GFR, EST NON AFRICAN AMERICAN: 72 mL/min
Glucose, Bld: 85 mg/dL (ref 70–99)
Potassium: 4.5 mEq/L (ref 3.5–5.3)
SODIUM: 139 meq/L (ref 135–145)

## 2014-08-25 LAB — CBC WITH DIFFERENTIAL/PLATELET
BASOS PCT: 1 % (ref 0–1)
Basophils Absolute: 0.1 10*3/uL (ref 0.0–0.1)
Eosinophils Absolute: 0.1 10*3/uL (ref 0.0–0.7)
Eosinophils Relative: 1 % (ref 0–5)
HCT: 38 % (ref 36.0–46.0)
Hemoglobin: 12.9 g/dL (ref 12.0–15.0)
LYMPHS ABS: 2.3 10*3/uL (ref 0.7–4.0)
LYMPHS PCT: 38 % (ref 12–46)
MCH: 30.4 pg (ref 26.0–34.0)
MCHC: 33.9 g/dL (ref 30.0–36.0)
MCV: 89.6 fL (ref 78.0–100.0)
MONO ABS: 0.4 10*3/uL (ref 0.1–1.0)
MONOS PCT: 6 % (ref 3–12)
MPV: 9.8 fL (ref 8.6–12.4)
NEUTROS ABS: 3.2 10*3/uL (ref 1.7–7.7)
Neutrophils Relative %: 54 % (ref 43–77)
Platelets: 267 10*3/uL (ref 150–400)
RBC: 4.24 MIL/uL (ref 3.87–5.11)
RDW: 14.3 % (ref 11.5–15.5)
WBC: 6 10*3/uL (ref 4.0–10.5)

## 2014-08-25 LAB — LIPID PANEL
CHOL/HDL RATIO: 2.5 ratio
Cholesterol: 152 mg/dL (ref 0–200)
HDL: 60 mg/dL (ref >=46)
LDL Cholesterol: 72 mg/dL (ref 0–99)
Triglycerides: 99 mg/dL (ref <150)
VLDL: 20 mg/dL (ref 0–40)

## 2014-08-25 LAB — HEMOGLOBIN A1C
Hgb A1c MFr Bld: 6.1 % — ABNORMAL HIGH (ref <5.7)
Mean Plasma Glucose: 128 mg/dL — ABNORMAL HIGH (ref <117)

## 2014-08-25 LAB — MAGNESIUM: MAGNESIUM: 2 mg/dL (ref 1.5–2.5)

## 2014-08-25 LAB — TSH: TSH: 0.608 u[IU]/mL (ref 0.350–4.500)

## 2014-08-25 MED ORDER — CLOBETASOL PROPIONATE 0.05 % EX CREA: TOPICAL_CREAM | Freq: Two times a day (BID) | CUTANEOUS | Status: DC

## 2014-08-25 MED ORDER — PHENTERMINE HCL 37.5 MG PO TABS: 37.5000 mg | ORAL_TABLET | Freq: Every day | ORAL | Status: DC

## 2014-08-25 NOTE — Patient Instructions (Signed)
Preventive Care for Adults  A healthy lifestyle and preventive care can promote health and wellness. Preventive health guidelines for women include the following key practices.  A routine yearly physical is a good way to check with your health care provider about your health and preventive screening. It is a chance to share any concerns and updates on your health and to receive a thorough exam.  Visit your dentist for a routine exam and preventive care every 6 months. Brush your teeth twice a day and floss once a day. Good oral hygiene prevents tooth decay and gum disease.  The frequency of eye exams is based on your age, health, family medical history, use of contact lenses, and other factors. Follow your health care provider's recommendations for frequency of eye exams.  Eat a healthy diet. Foods like vegetables, fruits, whole grains, low-fat dairy products, and lean protein foods contain the nutrients you need without too many calories. Decrease your intake of foods high in solid fats, added sugars, and salt. Eat the right amount of calories for you.Get information about a proper diet from your health care provider, if necessary.  Regular physical exercise is one of the most important things you can do for your health. Most adults should get at least 150 minutes of moderate-intensity exercise (any activity that increases your heart rate and causes you to sweat) each week. In addition, most adults need muscle-strengthening exercises on 2 or more days a week.  Maintain a healthy weight. The body mass index (BMI) is a screening tool to identify possible weight problems. It provides an estimate of body fat based on height and weight. Your health care provider can find your BMI and can help you achieve or maintain a healthy weight.For adults 20 years and older:  A BMI below 18.5 is considered underweight.  A BMI of 18.5 to 24.9 is normal.  A BMI of 25 to 29.9 is considered overweight.  A BMI  of 30 and above is considered obese.  Maintain normal blood lipids and cholesterol levels by exercising and minimizing your intake of saturated fat. Eat a balanced diet with plenty of fruit and vegetables. Blood tests for lipids and cholesterol should begin at age 58 and be repeated every 5 years. If your lipid or cholesterol levels are high, you are over 50, or you are at high risk for heart disease, you may need your cholesterol levels checked more frequently.Ongoing high lipid and cholesterol levels should be treated with medicines if diet and exercise are not working.  If you smoke, find out from your health care provider how to quit. If you do not use tobacco, do not start.  Lung cancer screening is recommended for adults aged 58-80 years who are at high risk for developing lung cancer because of a history of smoking. A yearly low-dose CT scan of the lungs is recommended for people who have at least a 30-pack-year history of smoking and are a current smoker or have quit within the past 15 years. A pack year of smoking is smoking an average of 1 pack of cigarettes a day for 1 year (for example: 1 pack a day for 30 years or 2 packs a day for 15 years). Yearly screening should continue until the smoker has stopped smoking for at least 15 years. Yearly screening should be stopped for people who develop a health problem that would prevent them from having lung cancer treatment.  High blood pressure causes heart disease and increases the risk of  stroke. Your blood pressure should be checked at least every 1 to 2 years. Ongoing high blood pressure should be treated with medicines if weight loss and exercise do not work.  If you are 55-79 years old, ask your health care provider if you should take aspirin to prevent strokes.  Diabetes screening involves taking a blood sample to check your fasting blood sugar level. This should be done once every 3 years, after age 45, if you are within normal weight and  without risk factors for diabetes. Testing should be considered at a younger age or be carried out more frequently if you are overweight and have at least 1 risk factor for diabetes.  Breast cancer screening is essential preventive care for women. You should practice "breast self-awareness." This means understanding the normal appearance and feel of your breasts and may include breast self-examination. Any changes detected, no matter how small, should be reported to a health care provider. Women in their 20s and 30s should have a clinical breast exam (CBE) by a health care provider as part of a regular health exam every 1 to 3 years. After age 40, women should have a CBE every year. Starting at age 40, women should consider having a mammogram (breast X-ray test) every year. Women who have a family history of breast cancer should talk to their health care provider about genetic screening. Women at a high risk of breast cancer should talk to their health care providers about having an MRI and a mammogram every year.  Breast cancer gene (BRCA)-related cancer risk assessment is recommended for women who have family members with BRCA-related cancers. BRCA-related cancers include breast, ovarian, tubal, and peritoneal cancers. Having family members with these cancers may be associated with an increased risk for harmful changes (mutations) in the breast cancer genes BRCA1 and BRCA2. Results of the assessment will determine the need for genetic counseling and BRCA1 and BRCA2 testing.  Routine pelvic exams to screen for cancer are no longer recommended for nonpregnant women who are considered low risk for cancer of the pelvic organs (ovaries, uterus, and vagina) and who do not have symptoms. Ask your health care provider if a screening pelvic exam is right for you.  If you have had past treatment for cervical cancer or a condition that could lead to cancer, you need Pap tests and screening for cancer for at least 20  years after your treatment. If Pap tests have been discontinued, your risk factors (such as having a new sexual partner) need to be reassessed to determine if screening should be resumed. Some women have medical problems that increase the chance of getting cervical cancer. In these cases, your health care provider may recommend more frequent screening and Pap tests.  Colorectal cancer can be detected and often prevented. Most routine colorectal cancer screening begins at the age of 50 years and continues through age 75 years. However, your health care provider may recommend screening at an earlier age if you have risk factors for colon cancer. On a yearly basis, your health care provider may provide home test kits to check for hidden blood in the stool. Use of a small camera at the end of a tube, to directly examine the colon (sigmoidoscopy or colonoscopy), can detect the earliest forms of colorectal cancer. Talk to your health care provider about this at age 50, when routine screening begins. Direct exam of the colon should be repeated every 5-10 years through age 75 years, unless early forms of pre-cancerous   polyps or small growths are found.  Hepatitis C blood testing is recommended for all people born from 1945 through 1965 and any individual with known risks for hepatitis C.  Pra  Osteoporosis is a disease in which the bones lose minerals and strength with aging. This can result in serious bone fractures or breaks. The risk of osteoporosis can be identified using a bone density scan. Women ages 65 years and over and women at risk for fractures or osteoporosis should discuss screening with their health care providers. Ask your health care provider whether you should take a calcium supplement or vitamin D to reduce the rate of osteoporosis.  Menopause can be associated with physical symptoms and risks. Hormone replacement therapy is available to decrease symptoms and risks. You should talk to your  health care provider about whether hormone replacement therapy is right for you.  Use sunscreen. Apply sunscreen liberally and repeatedly throughout the day. You should seek shade when your shadow is shorter than you. Protect yourself by wearing long sleeves, pants, a wide-brimmed hat, and sunglasses year round, whenever you are outdoors.  Once a month, do a whole body skin exam, using a mirror to look at the skin on your back. Tell your health care provider of new moles, moles that have irregular borders, moles that are larger than a pencil eraser, or moles that have changed in shape or color.  Stay current with required vaccines (immunizations).  Influenza vaccine. All adults should be immunized every year.  Tetanus, diphtheria, and acellular pertussis (Td, Tdap) vaccine. Pregnant women should receive 1 dose of Tdap vaccine during each pregnancy. The dose should be obtained regardless of the length of time since the last dose. Immunization is preferred during the 27th-36th week of gestation. An adult who has not previously received Tdap or who does not know her vaccine status should receive 1 dose of Tdap. This initial dose should be followed by tetanus and diphtheria toxoids (Td) booster doses every 10 years. Adults with an unknown or incomplete history of completing a 3-dose immunization series with Td-containing vaccines should begin or complete a primary immunization series including a Tdap dose. Adults should receive a Td booster every 10 years.  Varicella vaccine. An adult without evidence of immunity to varicella should receive 2 doses or a second dose if she has previously received 1 dose. Pregnant females who do not have evidence of immunity should receive the first dose after pregnancy. This first dose should be obtained before leaving the health care facility. The second dose should be obtained 4-8 weeks after the first dose.  Human papillomavirus (HPV) vaccine. Females aged 13-26 years  who have not received the vaccine previously should obtain the 3-dose series. The vaccine is not recommended for use in pregnant females. However, pregnancy testing is not needed before receiving a dose. If a female is found to be pregnant after receiving a dose, no treatment is needed. In that case, the remaining doses should be delayed until after the pregnancy. Immunization is recommended for any person with an immunocompromised condition through the age of 26 years if she did not get any or all doses earlier. During the 3-dose series, the second dose should be obtained 4-8 weeks after the first dose. The third dose should be obtained 24 weeks after the first dose and 16 weeks after the second dose.  Zoster vaccine. One dose is recommended for adults aged 60 years or older unless certain conditions are present.  Measles, mumps, and rubella (  MMR) vaccine. Adults born before [birth year] generally are considered immune to measles and mumps. Adults born in [birth year] or later should have 1 or more doses of MMR vaccine unless there is a contraindication to the vaccine or there is laboratory evidence of immunity to each of the three diseases. A routine second dose of MMR vaccine should be obtained at least 28 days after the first dose for students attending postsecondary schools, health care workers, or international travelers. People who received inactivated measles vaccine or an unknown type of measles vaccine during 1963-1967 should receive 2 doses of MMR vaccine. People who received inactivated mumps vaccine or an unknown type of mumps vaccine before 1979 and are at high risk for mumps infection should consider immunization with 2 doses of MMR vaccine. For females of childbearing age, rubella immunity should be determined. If there is no evidence of immunity, females who are not pregnant should be vaccinated. If there is no evidence of immunity, females who are pregnant should delay immunization until after pregnancy.  Unvaccinated health care workers born before [birth year] who lack laboratory evidence of measles, mumps, or rubella immunity or laboratory confirmation of disease should consider measles and mumps immunization with 2 doses of MMR vaccine or rubella immunization with 1 dose of MMR vaccine.  Pneumococcal 13-valent conjugate (PCV13) vaccine. When indicated, a person who is uncertain of her immunization history and has no record of immunization should receive the PCV13 vaccine. An adult aged 39 years or older who has certain medical conditions and has not been previously immunized should receive 1 dose of PCV13 vaccine. This PCV13 should be followed with a dose of pneumococcal polysaccharide (PPSV23) vaccine. The PPSV23 vaccine dose should be obtained at least 8 weeks after the dose of PCV13 vaccine. An adult aged 62 years or older who has certain medical conditions and previously received 1 or more doses of PPSV23 vaccine should receive 1 dose of PCV13. The PCV13 vaccine dose should be obtained 1 or more years after the last PPSV23 vaccine dose.    Pneumococcal polysaccharide (PPSV23) vaccine. When PCV13 is also indicated, PCV13 should be obtained first. All adults aged 67 years and older should be immunized. An adult younger than age 45 years who has certain medical conditions should be immunized. Any person who resides in a nursing home or long-term care facility should be immunized. An adult smoker should be immunized. People with an immunocompromised condition and certain other conditions should receive both PCV13 and PPSV23 vaccines. People with human immunodeficiency virus (HIV) infection should be immunized as soon as possible after diagnosis. Immunization during chemotherapy or radiation therapy should be avoided. Routine use of PPSV23 vaccine is not recommended for American Indians, [place] Natives, or people younger than 65 years unless there are medical conditions that require PPSV23 vaccine. When indicated,  people who have unknown immunization and have no record of immunization should receive PPSV23 vaccine. One-time revaccination 5 years after the first dose of PPSV23 is recommended for people aged 19-64 years who have chronic kidney failure, nephrotic syndrome, asplenia, or immunocompromised conditions. People who received 1-2 doses of PPSV23 before age 23 years should receive another dose of PPSV23 vaccine at age 35 years or later if at least 5 years have passed since the previous dose. Doses of PPSV23 are not needed for people immunized with PPSV23 at or after age 38 years.  Preventive Services / Frequency   Ages 43 to 86 years  Blood pressure check.  Lipid and cholesterol check.  Lung  cancer screening. / Every year if you are aged 43-80 years and have a 30-pack-year history of smoking and currently smoke or have quit within the past 15 years. Yearly screening is stopped once you have quit smoking for at least 15 years or develop a health problem that would prevent you from having lung cancer treatment.  Clinical breast exam.** / Every year after age 57 years.  BRCA-related cancer risk assessment.** / For women who have family members with a BRCA-related cancer (breast, ovarian, tubal, or peritoneal cancers).  Mammogram.** / Every year beginning at age 38 years and continuing for as long as you are in good health. Consult with your health care provider.  Pap test.** / Every 3 years starting at age 50 years through age 98 or 60 years with a history of 3 consecutive normal Pap tests.  HPV screening.** / Every 3 years from ages 59 years through ages 79 to 80 years with a history of 3 consecutive normal Pap tests.  Fecal occult blood test (FOBT) of stool. / Every year beginning at age 81 years and continuing until age 39 years. You may not need to do this test if you get a colonoscopy every 10 years.  Flexible sigmoidoscopy or colonoscopy.** / Every 5 years for a flexible sigmoidoscopy or  every 10 years for a colonoscopy beginning at age 18 years and continuing until age 76 years.  Hepatitis C blood test.** / For all people born from [birth year] through 1965 and any individual with known risks for hepatitis C.  Skin self-exam. / Monthly.  Influenza vaccine. / Every year.  Tetanus, diphtheria, and acellular pertussis (Tdap/Td) vaccine.** / Consult your health care provider. Pregnant women should receive 1 dose of Tdap vaccine during each pregnancy. 1 dose of Td every 10 years.  Varicella vaccine.** / Consult your health care provider. Pregnant females who do not have evidence of immunity should receive the first dose after pregnancy.  Zoster vaccine.** / 1 dose for adults aged 48 years or older.  Pneumococcal 13-valent conjugate (PCV13) vaccine.** / Consult your health care provider.  Pneumococcal polysaccharide (PPSV23) vaccine.** / 1 to 2 doses if you smoke cigarettes or if you have certain conditions.  Meningococcal vaccine.** / Consult your health care provider.  Hepatitis A vaccine.** / Consult your health care provider.  Hepatitis B vaccine.** / Consult your health care provider. Screening for abdominal aortic aneurysm (AAA)  by ultrasound is recommended for people over 50 who have history of high blood pressure or who are current or former smokers.

## 2014-08-25 NOTE — Progress Notes (Signed)
Patient ID: Heather Walton, female   DOB: September 05, 1960, 54 y.o.   MRN: [id number]  Complete Physical  Assessment and Plan:   1. Essential hypertension -well controlled at home -cont diet and exercise -monitor - TSH  2. Hyperlipidemia -diet and exercise - Lipid panel  3. Obesity -diet and exercise -phentermine -recheck in 1.5 months  4. Screening for diabetes mellitus (DM)  - Hemoglobin A1c - Insulin, random  5. Medication management  - CBC with Differential/Platelet - Hepatic function panel - BASIC METABOLIC PANEL WITH GFR - Magnesium  6. Vitamin D deficiency -cont supplement - Vit D  25 hydroxy (rtn osteoporosis monitoring)    Discussed med's effects and SE's. Screening labs and tests as requested with regular follow-up as recommended.  HPI  54 y.o. female  presents for a complete physical.  Her blood pressure has been controlled at home, today their BP is BP: 126/80 mmHg.  She does workout. She denies chest pain, shortness of breath, dizziness.   She is not on cholesterol medication and denies myalgias. Her cholesterol is at goal. The cholesterol last visit was:  Lab Results  Component Value Date   CHOL 144 08/19/2013   HDL 54 08/19/2013   LDLCALC 71 08/19/2013   TRIG 96 08/19/2013   CHOLHDL 2.7 08/19/2013  .  She has been working on diet and exercise for prediabetes, she is not on bASA, she is not on ACE/ARB and denies foot ulcerations, hyperglycemia, hypoglycemia , increased appetite, nausea, paresthesia of the feet, polydipsia, polyuria, visual disturbances, vomiting and weight loss. Last A1C in the office was:  Lab Results  Component Value Date   HGBA1C 6.3* 12/01/2013    Patient is on Vitamin D supplement.   Lab Results  Component Value Date   VD25OH 69 08/19/2013      She does report a spot that has been itching on her neck.  She has been using cortisone on it which helps.    Current Medications:  Current Outpatient Prescriptions on File  Prior to Visit  Medication Sig Dispense Refill  . Ascorbic Acid (VITAMIN C) 1000 MG tablet Take 1,000 mg by mouth daily.    Marland Kitchen aspirin 81 MG tablet Take 81 mg by mouth daily.    . cefUROXime (CEFTIN) 500 MG tablet Take 1 tablet (500 mg total) by mouth 2 (two) times daily with a meal. 10 days 20 tablet 0  . Cholecalciferol (VITAMIN D-3) 5000 UNITS TABS Take 5,000 Units by mouth daily.    . clobetasol cream (TEMOVATE) 0.05 %     . glucosamine-chondroitin 500-400 MG tablet Take 1 tablet by mouth daily.    . Magnesium Chloride (MAGNESIUM DR PO) Take 250 mg by mouth daily.     . Multiple Vitamin (MULTIVITAMIN) tablet Take 1 tablet by mouth daily.    . vitamin E 400 UNIT capsule Take 400 Units by mouth daily.     No current facility-administered medications on file prior to visit.    Health Maintenance:   Immunization History  Administered Date(s) Administered  . Influenza-Unspecified 12/07/2013  . PPD Test 08/19/2013  . Tdap 01/12/2010   -Colonoscopy in 2014  Not due until 2024     Patient Care Team: Unk Pinto, MD as PCP - General (Internal Medicine) Deneise Lever, MD as Consulting Physician (Pulmonary Disease) Irene Shipper, MD as Consulting Physician (Gastroenterology) Lavonna Monarch, MD as Consulting Physician (Dermatology)  Allergies:  Allergies  Allergen Reactions  . Levaquin [Levofloxacin In D5w] Nausea Only  .  Maxitrol [Neomycin-Polymyxin-Dexameth]     Distorted patient's vision for over a month  . Prilosec [Omeprazole] Rash    Medical History:  Past Medical History  Diagnosis Date  . Hemorrhoid   . Vitamin D deficiency   . Hyperlipidemia   . Hypertension   . GERD (gastroesophageal reflux disease)   . HSV-1 infection   . DDD (degenerative disc disease), lumbar   . OSA (obstructive sleep apnea)     Surgical History:  Past Surgical History  Procedure Laterality Date  . Diagnostic laparoscopy  1998    Family History:  Family History  Problem Relation  Age of Onset  . Lung cancer Father   . Diabetes Maternal Grandmother   . Diabetes Maternal Grandfather   . Diabetes Paternal Grandmother   . Diabetes Paternal Grandfather   . Hypertension Mother     Social History:  History  Substance Use Topics  . Smoking status: Never Smoker   . Smokeless tobacco: Never Used  . Alcohol Use: No    Review of Systems: Review of Systems  Constitutional: Negative for fever, chills and malaise/fatigue.  HENT: Negative for congestion, ear pain, nosebleeds and sore throat.   Eyes: Negative.   Respiratory: Negative for cough, shortness of breath and wheezing.   Cardiovascular: Negative for chest pain, palpitations and leg swelling.  Gastrointestinal: Negative for heartburn, nausea, vomiting, diarrhea, constipation, blood in stool and melena.  Genitourinary: Negative for dysuria, urgency and frequency.  Skin: Positive for itching.  Neurological: Negative for dizziness, sensory change and headaches.  Psychiatric/Behavioral: Negative for depression. The patient is not nervous/anxious and does not have insomnia.     Physical Exam: Estimated body mass index is 35.48 kg/(m^2) as calculated from the following:   Height as of this encounter: 5' 3.25" (1.607 m).   Weight as of this encounter: 202 lb (91.627 kg). BP 126/80 mmHg  Pulse 76  Temp(Src) 98.2 F (36.8 C) (Temporal)  Resp 16  Ht 5' 3.25" (1.607 m)  Wt 202 lb (91.627 kg)  BMI 35.48 kg/m2  General Appearance: Well nourished well developed, in no apparent distress.  Eyes: PERRLA, EOMs, conjunctiva no swelling or erythema ENT/Mouth: Ear canals normal without obstruction, swelling, erythema, or discharge.  TMs normal bilaterally with no erythema, bulging, retraction, or loss of landmark.  Oropharynx moist and clear with no exudate, erythema, or swelling.   Neck: Supple, thyroid normal. No bruits.  No cervical adenopathy Respiratory: Respiratory effort normal, Breath sounds clear A&P without  wheeze, rhonchi, rales.   Cardio: RRR without murmurs, rubs or gallops. Brisk peripheral pulses without edema.  Chest: symmetric, with normal excursions Abdomen: Soft, nontender, no guarding, rebound, hernias, masses, or organomegaly.  Lymphatics: Non tender without lymphadenopathy.  Musculoskeletal: Full ROM all peripheral extremities,5/5 strength, and normal gait.  Skin: Warm, dry without rashes, lesions, ecchymosis. Neuro: Awake and oriented X 3, Cranial nerves intact, reflexes equal bilaterally. Normal muscle tone, no cerebellar symptoms. Sensation intact.  Psych:  normal affect, Insight and Judgment appropriate.   Over 40 minutes of exam, counseling, chart review and critical decision making was performed  FORCUCCI, Dalaney Needle 10:48 AM [facility]

## 2014-08-26 LAB — INSULIN, RANDOM: Insulin: 5 u[IU]/mL (ref 2.0–19.6)

## 2014-08-26 LAB — VITAMIN D 25 HYDROXY (VIT D DEFICIENCY, FRACTURES): VIT D 25 HYDROXY: 29 ng/mL — AB (ref 30–100)

## 2014-08-27 ENCOUNTER — Encounter: Payer: Self-pay | Admitting: Internal Medicine

## 2014-09-14 ENCOUNTER — Encounter: Payer: Self-pay | Admitting: Internal Medicine

## 2014-09-16 ENCOUNTER — Other Ambulatory Visit: Payer: Self-pay | Admitting: Internal Medicine

## 2014-09-16 MED ORDER — BISOPROLOL-HYDROCHLOROTHIAZIDE 2.5-6.25 MG PO TABS: 1.0000 | ORAL_TABLET | Freq: Every day | ORAL | Status: DC

## 2014-09-20 ENCOUNTER — Encounter: Payer: Self-pay | Admitting: Physician Assistant

## 2014-10-06 ENCOUNTER — Encounter: Payer: Self-pay | Admitting: Internal Medicine

## 2014-10-06 ENCOUNTER — Ambulatory Visit (INDEPENDENT_AMBULATORY_CARE_PROVIDER_SITE_OTHER): Payer: BLUE CROSS/BLUE SHIELD | Admitting: Internal Medicine

## 2014-10-06 VITALS — BP 116/84 | HR 64 | Temp 98.4°F | Resp 18 | Ht 63.25 in | Wt 197.0 lb

## 2014-10-06 DIAGNOSIS — I10 Essential (primary) hypertension: Secondary | ICD-10-CM

## 2014-10-06 NOTE — Patient Instructions (Signed)
Cerumen Impaction °A cerumen impaction is when the wax in your ear forms a plug. This plug usually causes reduced hearing. Sometimes it also causes an earache or dizziness. Removing a cerumen impaction can be difficult and painful. The wax sticks to the ear canal. The canal is sensitive and bleeds easily. If you try to remove a heavy wax buildup with a cotton tipped swab, you may push it in further. °Irrigation with water, suction, and small ear curettes may be used to clear out the wax. If the impaction is fixed to the skin in the ear canal, ear drops may be needed for a few days to loosen the wax. People who build up a lot of wax frequently can use ear wax removal products available in your local drugstore. °SEEK MEDICAL CARE IF:  °You develop an earache, increased hearing loss, or marked dizziness. °Document Released: 04/04/2004 Document Revised: 05/20/2011 Document Reviewed: 05/25/2009 °ExitCare® Patient Information ©2015 [employer]. This information is not intended to replace advice given to you by your health care provider. Make sure you discuss any questions you have with your health care provider. ° °

## 2014-10-06 NOTE — Progress Notes (Signed)
Patient ID: Heather Walton, female   DOB: 1960/03/25, 54 y.o.   MRN: [id number]  Assessment and Plan:   1. Essential hypertension -d/c ziac -monitor at home -recheck in 6 months  HPI 54 y.o.female presents for 1 month follow up of starting phentermine and blood pressure medications. Patient reports that they have been doing well.  female is not taking their medication.  They are having difficulty with their medications.  They report no adverse reactions.  She reports that she had a lot of consitpation and also was having increased BP changes on the medication and she stopped taking it.  She reports that bowels are back to normal.    Past Medical History  Diagnosis Date  . Hemorrhoid   . Vitamin D deficiency   . Hyperlipidemia   . Hypertension   . GERD (gastroesophageal reflux disease)   . HSV-1 infection   . DDD (degenerative disc disease), lumbar   . OSA (obstructive sleep apnea)      Allergies  Allergen Reactions  . Levaquin [Levofloxacin In D5w] Nausea Only  . Maxitrol [Neomycin-Polymyxin-Dexameth]     Distorted patient's vision for over a month  . Phentermine   . Prilosec [Omeprazole] Rash      Current Outpatient Prescriptions on File Prior to Visit  Medication Sig Dispense Refill  . Ascorbic Acid (VITAMIN C) 1000 MG tablet Take 1,000 mg by mouth daily.    Marland Kitchen aspirin 81 MG tablet Take 81 mg by mouth daily.    . Cholecalciferol (VITAMIN D-3) 5000 UNITS TABS Take 5,000 Units by mouth daily.    . clobetasol cream (TEMOVATE) 0.05 % Apply topically 2 (two) times daily. 30 g 1  . glucosamine-chondroitin 500-400 MG tablet Take 1 tablet by mouth daily.    . Magnesium Chloride (MAGNESIUM DR PO) Take 250 mg by mouth daily.     . Multiple Vitamin (MULTIVITAMIN) tablet Take 1 tablet by mouth daily.    . vitamin E 400 UNIT capsule Take 400 Units by mouth daily.     No current facility-administered medications on file prior to visit.    ROS: all negative except above.   Physical  Exam: Filed Weights   10/06/14 1630  Weight: 197 lb (89.359 kg)   Wt Readings from Last 3 Encounters:  10/06/14 197 lb (89.359 kg)  08/25/14 202 lb (91.627 kg)  02/23/14 210 lb (95.255 kg)    BP 116/84 mmHg  Pulse 64  Temp(Src) 98.4 F (36.9 C) (Temporal)  Resp 18  Ht 5' 3.25" (1.607 m)  Wt 197 lb (89.359 kg)  BMI 34.60 kg/m2 General Appearance: Well developed well nourished, non-toxic appearing in no apparent distress. Eyes: PERRLA, EOMs, conjunctiva w/ no swelling or erythema or discharge Sinuses: No Frontal/maxillary tenderness ENT/Mouth: Ear canals clear without swelling or erythema.  TM's normal bilaterally with no retractions, bulging, or loss of landmarks.   Neck: Supple, thyroid normal, no notable JVD  Respiratory: Respiratory effort normal, Clear breath sounds anteriorly and posteriorly bilaterally without rales, rhonchi, wheezing or stridor. No retractions or accessory muscle usage. Cardio: RRR with no MRGs.   Abdomen: Soft, + BS.  Non tender, no guarding, rebound, hernias, masses.  Musculoskeletal: Full ROM, 5/5 strength, normal gait.  Skin: Warm, dry without rashes  Neuro: Awake and oriented X 3, Cranial nerves intact. Normal muscle tone, no cerebellar symptoms. Sensation intact.  Psych: normal affect, Insight and Judgment appropriate.     Starlyn Skeans, PA-C 4:38 PM [facility]

## 2015-03-27 ENCOUNTER — Other Ambulatory Visit: Payer: Self-pay | Admitting: *Deleted

## 2015-04-12 ENCOUNTER — Ambulatory Visit (INDEPENDENT_AMBULATORY_CARE_PROVIDER_SITE_OTHER): Payer: BLUE CROSS/BLUE SHIELD | Admitting: Internal Medicine

## 2015-04-12 VITALS — BP 128/84 | HR 75 | Wt 209.0 lb

## 2015-04-12 DIAGNOSIS — E785 Hyperlipidemia, unspecified: Secondary | ICD-10-CM

## 2015-04-12 DIAGNOSIS — E669 Obesity, unspecified: Secondary | ICD-10-CM

## 2015-04-12 DIAGNOSIS — Z79899 Other long term (current) drug therapy: Secondary | ICD-10-CM | POA: Diagnosis not present

## 2015-04-12 DIAGNOSIS — I10 Essential (primary) hypertension: Secondary | ICD-10-CM

## 2015-04-12 MED ORDER — CLOBETASOL PROPIONATE 0.05 % EX CREA: TOPICAL_CREAM | Freq: Two times a day (BID) | CUTANEOUS | Status: DC

## 2015-04-12 NOTE — Progress Notes (Signed)
Patient ID: Heather Walton, female   DOB: 10/11/1960, 55 y.o.   MRN: [id number]  Assessment and Plan:  Hypertension:  -reports some higher BPs but here in office BP is normal. -Continue medication,  -monitor blood pressure at home.  -Continue DASH diet.   -Reminder to go to the ER if any CP, SOB, nausea, dizziness, severe HA, changes vision/speech, left arm numbness and tingling, and jaw pain.  Cholesterol: -Continue diet and exercise.  -Check cholesterol.   Pre-diabetes: -Continue diet and exercise.  -Check A1C  Vitamin D Def: -check level -continue medications.   Continue diet and meds as discussed. Further disposition pending results of labs.  HPI 55 y.o. female  presents for 3 month follow up with hypertension, hyperlipidemia, prediabetes and vitamin D.   Her blood pressure has been controlled at home, today their BP is BP: 128/84 mmHg.   She does workout. She denies chest pain, shortness of breath, dizziness.   She is not on cholesterol medication and denies myalgias. Her cholesterol is at goal. The cholesterol last visit was:   Lab Results  Component Value Date   CHOL 152 08/25/2014   HDL 60 08/25/2014   LDLCALC 72 08/25/2014   TRIG 99 08/25/2014   CHOLHDL 2.5 08/25/2014     She has been working on diet and exercise for prediabetes, and denies foot ulcerations, hyperglycemia, hypoglycemia , increased appetite, nausea, paresthesia of the feet, polydipsia, polyuria, visual disturbances, vomiting and weight loss. Last A1C in the office was:  Lab Results  Component Value Date   HGBA1C 6.1* 08/25/2014    Patient is on Vitamin D supplement.  Lab Results  Component Value Date   VD25OH 29* 08/25/2014       Current Medications:  Current Outpatient Prescriptions on File Prior to Visit  Medication Sig Dispense Refill  . Ascorbic Acid (VITAMIN C) 1000 MG tablet Take 1,000 mg by mouth daily.    Marland Kitchen aspirin 81 MG tablet Take 81 mg by mouth daily.    . Cholecalciferol  (VITAMIN D-3) 5000 UNITS TABS Take 5,000 Units by mouth daily.    . clobetasol cream (TEMOVATE) 0.05 % Apply topically 2 (two) times daily. 30 g 1  . glucosamine-chondroitin 500-400 MG tablet Take 1 tablet by mouth daily.    . Magnesium Chloride (MAGNESIUM DR PO) Take 250 mg by mouth daily.     . Multiple Vitamin (MULTIVITAMIN) tablet Take 1 tablet by mouth daily.    . vitamin E 400 UNIT capsule Take 400 Units by mouth daily.     No current facility-administered medications on file prior to visit.    Medical History:  Past Medical History  Diagnosis Date  . Hemorrhoid   . Vitamin D deficiency   . Hyperlipidemia   . Hypertension   . GERD (gastroesophageal reflux disease)   . HSV-1 infection   . DDD (degenerative disc disease), lumbar   . OSA (obstructive sleep apnea)     Allergies:  Allergies  Allergen Reactions  . Levaquin [Levofloxacin In D5w] Nausea Only  . Maxitrol [Neomycin-Polymyxin-Dexameth]     Distorted patient's vision for over a month  . Phentermine   . Prilosec [Omeprazole] Rash     Review of Systems:  Review of Systems  Constitutional: Negative for fever, chills and malaise/fatigue.  HENT: Negative for congestion, ear pain and sore throat.   Eyes: Negative.   Respiratory: Negative for cough, shortness of breath and wheezing.   Cardiovascular: Negative for chest pain, palpitations and leg swelling.  Gastrointestinal: Negative for heartburn, diarrhea, constipation, blood in stool and melena.  Genitourinary: Negative.   Neurological: Negative for dizziness, sensory change, loss of consciousness and headaches.  Psychiatric/Behavioral: Negative for depression. The patient is not nervous/anxious and does not have insomnia.     Family history- Review and unchanged  Social history- Review and unchanged  Physical Exam: BP 128/84 mmHg  Pulse 75  Wt 209 lb (94.802 kg)  SpO2 99% Wt Readings from Last 3 Encounters:  04/12/15 209 lb (94.802 kg)  10/06/14 197 lb  (89.359 kg)  08/25/14 202 lb (91.627 kg)    General Appearance: Well nourished well developed, in no apparent distress. Eyes: PERRLA, EOMs, conjunctiva no swelling or erythema ENT/Mouth: Ear canals normal without obstruction, swelling, erythma, discharge.  TMs normal bilaterally.  Oropharynx moist, clear, without exudate, or postoropharyngeal swelling. Neck: Supple, thyroid normal,no cervical adenopathy  Respiratory: Respiratory effort normal, Breath sounds clear A&P without rhonchi, wheeze, or rale.  No retractions, no accessory usage. Cardio: RRR with no MRGs. Brisk peripheral pulses without edema.  Abdomen: Soft, + BS,  Non tender, no guarding, rebound, hernias, masses. Musculoskeletal: Full ROM, 5/5 strength, Normal gait Skin: Warm, dry without rashes, lesions, ecchymosis.  Neuro: Awake and oriented X 3, Cranial nerves intact. Normal muscle tone, no cerebellar symptoms. Psych: Normal affect, Insight and Judgment appropriate.    Starlyn Skeans, PA-C 4:28 PM [facility]

## 2015-04-12 NOTE — Patient Instructions (Signed)
Your normal blood pressure range is 120-140/70-85.  If you are consistently 150/90 or greater please give me a call or send me a mychart message.   The normal range for A1C is less than 5.7.  A1C's over 6.5 are considered to be diabetic.

## 2015-04-13 LAB — BASIC METABOLIC PANEL WITH GFR
BUN: 14 mg/dL (ref 7–25)
CO2: 24 mmol/L (ref 20–31)
Calcium: 8.8 mg/dL (ref 8.6–10.4)
Chloride: 106 mmol/L (ref 98–110)
Creat: 0.9 mg/dL (ref 0.50–1.05)
GFR, EST NON AFRICAN AMERICAN: 73 mL/min (ref >=60)
GFR, Est African American: 84 mL/min (ref >=60)
GLUCOSE: 109 mg/dL — AB (ref 65–99)
Potassium: 4.1 mmol/L (ref 3.5–5.3)
Sodium: 139 mmol/L (ref 135–146)

## 2015-04-13 LAB — CBC WITH DIFFERENTIAL/PLATELET
Basophils Absolute: 0.1 10*3/uL (ref 0.0–0.1)
Basophils Relative: 1 % (ref 0–1)
EOS ABS: 0.1 10*3/uL (ref 0.0–0.7)
Eosinophils Relative: 1 % (ref 0–5)
HEMATOCRIT: 35.4 % — AB (ref 36.0–46.0)
Hemoglobin: 11.9 g/dL — ABNORMAL LOW (ref 12.0–15.0)
LYMPHS PCT: 48 % — AB (ref 12–46)
Lymphs Abs: 3 10*3/uL (ref 0.7–4.0)
MCH: 30.4 pg (ref 26.0–34.0)
MCHC: 33.6 g/dL (ref 30.0–36.0)
MCV: 90.3 fL (ref 78.0–100.0)
MONO ABS: 0.4 10*3/uL (ref 0.1–1.0)
MPV: 9.7 fL (ref 8.6–12.4)
Monocytes Relative: 7 % (ref 3–12)
Neutro Abs: 2.7 10*3/uL (ref 1.7–7.7)
Neutrophils Relative %: 43 % (ref 43–77)
PLATELETS: 224 10*3/uL (ref 150–400)
RBC: 3.92 MIL/uL (ref 3.87–5.11)
RDW: 13.7 % (ref 11.5–15.5)
WBC: 6.3 10*3/uL (ref 4.0–10.5)

## 2015-04-13 LAB — HEPATIC FUNCTION PANEL
ALBUMIN: 4 g/dL (ref 3.6–5.1)
ALK PHOS: 53 U/L (ref 33–130)
ALT: 33 U/L — AB (ref 6–29)
AST: 28 U/L (ref 10–35)
BILIRUBIN INDIRECT: 0.2 mg/dL (ref 0.2–1.2)
Bilirubin, Direct: 0.1 mg/dL (ref <=0.2)
TOTAL PROTEIN: 6.7 g/dL (ref 6.1–8.1)
Total Bilirubin: 0.3 mg/dL (ref 0.2–1.2)

## 2015-04-13 LAB — LIPID PANEL
Cholesterol: 155 mg/dL (ref 125–200)
HDL: 55 mg/dL (ref >=46)
LDL Cholesterol: 66 mg/dL (ref <130)
Total CHOL/HDL Ratio: 2.8 Ratio (ref <=5.0)
Triglycerides: 172 mg/dL — ABNORMAL HIGH (ref <150)
VLDL: 34 mg/dL — ABNORMAL HIGH (ref <30)

## 2015-04-13 LAB — TSH: TSH: 0.801 u[IU]/mL (ref 0.350–4.500)

## 2015-06-01 ENCOUNTER — Other Ambulatory Visit: Payer: Self-pay

## 2015-06-01 DIAGNOSIS — Z1231 Encounter for screening mammogram for malignant neoplasm of breast: Secondary | ICD-10-CM

## 2015-06-26 ENCOUNTER — Ambulatory Visit: Payer: BLUE CROSS/BLUE SHIELD

## 2015-07-10 ENCOUNTER — Ambulatory Visit
Admission: RE | Admit: 2015-07-10 | Discharge: 2015-07-10 | Disposition: A | Discharge status: Home / Self Care | Payer: BLUE CROSS/BLUE SHIELD | Source: Ambulatory Visit

## 2015-07-10 DIAGNOSIS — Z1231 Encounter for screening mammogram for malignant neoplasm of breast: Secondary | ICD-10-CM | POA: Diagnosis not present

## 2015-08-30 ENCOUNTER — Ambulatory Visit (INDEPENDENT_AMBULATORY_CARE_PROVIDER_SITE_OTHER): Payer: BLUE CROSS/BLUE SHIELD | Admitting: Internal Medicine

## 2015-08-30 ENCOUNTER — Encounter: Payer: Self-pay | Admitting: Internal Medicine

## 2015-08-30 VITALS — BP 126/72 | HR 76 | Temp 98.2°F | Resp 18 | Ht 63.25 in | Wt 202.0 lb

## 2015-08-30 DIAGNOSIS — E785 Hyperlipidemia, unspecified: Secondary | ICD-10-CM

## 2015-08-30 DIAGNOSIS — Z1389 Encounter for screening for other disorder: Secondary | ICD-10-CM

## 2015-08-30 DIAGNOSIS — E559 Vitamin D deficiency, unspecified: Secondary | ICD-10-CM | POA: Diagnosis not present

## 2015-08-30 DIAGNOSIS — Z139 Encounter for screening, unspecified: Secondary | ICD-10-CM | POA: Diagnosis not present

## 2015-08-30 DIAGNOSIS — I10 Essential (primary) hypertension: Secondary | ICD-10-CM | POA: Diagnosis not present

## 2015-08-30 DIAGNOSIS — Z0001 Encounter for general adult medical examination with abnormal findings: Secondary | ICD-10-CM

## 2015-08-30 DIAGNOSIS — R6889 Other general symptoms and signs: Secondary | ICD-10-CM | POA: Diagnosis not present

## 2015-08-30 DIAGNOSIS — Z1329 Encounter for screening for other suspected endocrine disorder: Secondary | ICD-10-CM

## 2015-08-30 DIAGNOSIS — K649 Unspecified hemorrhoids: Secondary | ICD-10-CM

## 2015-08-30 DIAGNOSIS — Z130 Encounter for screening for diseases of the blood and blood-forming organs and certain disorders involving the immune mechanism: Secondary | ICD-10-CM

## 2015-08-30 DIAGNOSIS — Z131 Encounter for screening for diabetes mellitus: Secondary | ICD-10-CM

## 2015-08-30 LAB — IRON AND TIBC
%SAT: 10 % — AB (ref 11–50)
IRON: 44 ug/dL — AB (ref 45–160)
TIBC: 424 ug/dL (ref 250–450)
UIBC: 380 ug/dL (ref 125–400)

## 2015-08-30 LAB — CBC WITH DIFFERENTIAL/PLATELET
BASOS PCT: 0 %
Basophils Absolute: 0 cells/uL (ref 0–200)
EOS ABS: 0 {cells}/uL — AB (ref 15–500)
Eosinophils Relative: 0 %
HEMATOCRIT: 38.9 % (ref 35.0–45.0)
HEMOGLOBIN: 12.9 g/dL (ref 11.7–15.5)
LYMPHS ABS: 2769 {cells}/uL (ref 850–3900)
LYMPHS PCT: 39 %
MCH: 29.2 pg (ref 27.0–33.0)
MCHC: 33.2 g/dL (ref 32.0–36.0)
MCV: 88 fL (ref 80.0–100.0)
MONO ABS: 426 {cells}/uL (ref 200–950)
MPV: 10.1 fL (ref 7.5–12.5)
Monocytes Relative: 6 %
NEUTROS PCT: 55 %
Neutro Abs: 3905 cells/uL (ref 1500–7800)
Platelets: 266 10*3/uL (ref 140–400)
RBC: 4.42 MIL/uL (ref 3.80–5.10)
RDW: 14.2 % (ref 11.0–15.0)
WBC: 7.1 10*3/uL (ref 3.8–10.8)

## 2015-08-30 LAB — BASIC METABOLIC PANEL WITH GFR
BUN: 13 mg/dL (ref 7–25)
CHLORIDE: 105 mmol/L (ref 98–110)
CO2: 24 mmol/L (ref 20–31)
Calcium: 9.6 mg/dL (ref 8.6–10.4)
Creat: 1.04 mg/dL (ref 0.50–1.05)
GFR, EST AFRICAN AMERICAN: 70 mL/min (ref >=60)
GFR, EST NON AFRICAN AMERICAN: 61 mL/min (ref >=60)
GLUCOSE: 78 mg/dL (ref 65–99)
POTASSIUM: 3.9 mmol/L (ref 3.5–5.3)
Sodium: 139 mmol/L (ref 135–146)

## 2015-08-30 LAB — HEPATIC FUNCTION PANEL
ALBUMIN: 4.3 g/dL (ref 3.6–5.1)
ALK PHOS: 62 U/L (ref 33–130)
ALT: 24 U/L (ref 6–29)
AST: 25 U/L (ref 10–35)
BILIRUBIN INDIRECT: 0.2 mg/dL (ref 0.2–1.2)
Bilirubin, Direct: 0.1 mg/dL (ref <=0.2)
TOTAL PROTEIN: 7.5 g/dL (ref 6.1–8.1)
Total Bilirubin: 0.3 mg/dL (ref 0.2–1.2)

## 2015-08-30 LAB — LIPID PANEL
Cholesterol: 161 mg/dL (ref 125–200)
HDL: 69 mg/dL (ref >=46)
LDL CALC: 62 mg/dL (ref <130)
Total CHOL/HDL Ratio: 2.3 Ratio (ref <=5.0)
Triglycerides: 149 mg/dL (ref <150)
VLDL: 30 mg/dL (ref <30)

## 2015-08-30 LAB — MAGNESIUM: Magnesium: 2 mg/dL (ref 1.5–2.5)

## 2015-08-30 LAB — VITAMIN B12: Vitamin B-12: >2000 pg/mL — ABNORMAL HIGH (ref 200–1100)

## 2015-08-30 LAB — TSH: TSH: 0.63 mIU/L

## 2015-08-30 MED ORDER — HYDROCORTISONE 2.5 % RE CREA: TOPICAL_CREAM | RECTAL | Status: AC

## 2015-08-30 NOTE — Progress Notes (Signed)
Complete Physical  Assessment and Plan:   1. Encounter for general adult medical examination with abnormal findings  - CBC with Differential/Platelet - BASIC METABOLIC PANEL WITH GFR - Hepatic function panel - Magnesium  2. Essential hypertension  - EKG 12-Lead - TSH  3. Hyperlipidemia  - Lipid panel  4. Screening for diabetes mellitus  - Hemoglobin A1c - Insulin, random  5. Screening for thyroid disorder -tsh  6. Screening for hematuria or proteinuria  - Urinalysis, Routine w reflex microscopic (not at [facility]) - Microalbumin / creatinine urine ratio  7. Screening for deficiency anemia  - Iron and TIBC - Vitamin B12  8. Hemorrhoids, unspecified hemorrhoid type  - hydrocortisone (ANUSOL-HC) 2.5 % rectal cream; Apply rectally 2 times daily  Dispense: 30 g; Refill: 1  9. Vitamin D deficiency  - VITAMIN D 25 Hydroxy (Vit-D Deficiency, Fractures)  Discussed med's effects and SE's. Screening labs and tests as requested with regular follow-up as recommended.  HPI  55 y.o. female  presents for a complete physical.  Her blood pressure has been controlled at home, today their BP is BP: 116/70 mmHg.  She does workout. She denies chest pain, shortness of breath, dizziness.  She is still going to the gym several times a week. She is lifting weights and is also doing cardio.    She is on cholesterol medication and denies myalgias. Her cholesterol is at goal. The cholesterol last visit was:  Lab Results  Component Value Date   CHOL 155 04/12/2015   HDL 55 04/12/2015   LDLCALC 66 04/12/2015   TRIG 172* 04/12/2015   CHOLHDL 2.8 04/12/2015  .  She has been working on diet and exercise for prediabetes, she is on bASA, she is not on ACE/ARB and denies foot ulcerations, hyperglycemia, hypoglycemia , increased appetite, nausea, paresthesia of the feet, polydipsia, polyuria, visual disturbances, vomiting and weight loss. Last A1C in the office was:  Lab Results  Component  Value Date   HGBA1C 6.1* 08/25/2014    Patient is on Vitamin D supplement.   Lab Results  Component Value Date   VD25OH 37* 08/25/2014     She notes that she has been having some issues with hemorrhoids.  She occasionally has to use preparation H.    Current Medications:  Current Outpatient Prescriptions on File Prior to Visit  Medication Sig Dispense Refill  . Ascorbic Acid (VITAMIN C) 1000 MG tablet Take 1,000 mg by mouth daily.    Marland Kitchen aspirin 81 MG tablet Take 81 mg by mouth daily.    . Cholecalciferol (VITAMIN D-3) 5000 UNITS TABS Take 5,000 Units by mouth daily.    . clobetasol cream (TEMOVATE) 0.05 % Apply topically 2 (two) times daily. 30 g 1  . glucosamine-chondroitin 500-400 MG tablet Take 1 tablet by mouth daily.    . Magnesium Chloride (MAGNESIUM DR PO) Take 250 mg by mouth daily.     . Multiple Vitamin (MULTIVITAMIN) tablet Take 1 tablet by mouth daily.    . vitamin E 400 UNIT capsule Take 400 Units by mouth daily.     No current facility-administered medications on file prior to visit.    Health Maintenance:   Immunization History  Administered Date(s) Administered  . Influenza-Unspecified 12/07/2013, 01/06/2015  . PPD Test 08/19/2013  . Tdap 01/12/2010    Tetanus: 2011 Flu vaccine: 2016 Pap: 11/10/14 MGM:May,1 17 Colonoscopy: 2014 Last Eye Exam: 11/16  Patient Care Team: Unk Pinto, MD as PCP - General (Internal Medicine) Deneise Lever,  MD as Consulting Physician (Pulmonary Disease) Irene Shipper, MD as Consulting Physician (Gastroenterology) Lavonna Monarch, MD as Consulting Physician (Dermatology)  Allergies:  Allergies  Allergen Reactions  . Levaquin [Levofloxacin In D5w] Nausea Only  . Maxitrol [Neomycin-Polymyxin-Dexameth]     Distorted patient's vision for over a month  . Phentermine   . Prilosec [Omeprazole] Rash    Medical History:  Past Medical History  Diagnosis Date  . Hemorrhoid   . Vitamin D deficiency   . Hyperlipidemia   .  Hypertension   . GERD (gastroesophageal reflux disease)   . HSV-1 infection   . DDD (degenerative disc disease), lumbar   . OSA (obstructive sleep apnea)     Surgical History:  Past Surgical History  Procedure Laterality Date  . Diagnostic laparoscopy  1998    Family History:  Family History  Problem Relation Age of Onset  . Lung cancer Father   . Diabetes Maternal Grandmother   . Diabetes Maternal Grandfather   . Diabetes Paternal Grandmother   . Diabetes Paternal Grandfather   . Hypertension Mother     Social History:  Social History  Substance Use Topics  . Smoking status: Never Smoker   . Smokeless tobacco: Never Used  . Alcohol Use: No    Review of Systems: Review of Systems  Constitutional: Negative for fever, chills and malaise/fatigue.  HENT: Negative for congestion, ear pain and sore throat.   Eyes: Negative.   Respiratory: Negative for cough, shortness of breath and wheezing.   Cardiovascular: Negative for chest pain, palpitations and leg swelling.  Gastrointestinal: Negative for heartburn, abdominal pain, diarrhea, constipation, blood in stool and melena.  Genitourinary: Negative for dysuria, urgency, frequency and hematuria.  Musculoskeletal: Negative.   Skin: Negative.   Neurological: Negative for dizziness, sensory change, loss of consciousness and headaches.  Psychiatric/Behavioral: Negative for depression. The patient is not nervous/anxious and does not have insomnia.     Physical Exam: Estimated body mass index is 35.48 kg/(m^2) as calculated from the following:   Height as of this encounter: 5' 3.25" (1.607 m).   Weight as of this encounter: 202 lb (91.627 kg). BP 116/70 mmHg  Temp(Src) 98.2 F (36.8 C) (Temporal)  Resp 18  Ht 5' 3.25" (1.607 m)  Wt 202 lb (91.627 kg)  BMI 35.48 kg/m2  General Appearance: Well nourished well developed, in no apparent distress.  Eyes: PERRLA, EOMs, conjunctiva no swelling or erythema ENT/Mouth: Ear canals  normal without obstruction, swelling, erythema, or discharge.  TMs normal bilaterally with no erythema, bulging, retraction, or loss of landmark.  Oropharynx moist and clear with no exudate, erythema, or swelling.   Neck: Supple, thyroid normal. No bruits.  No cervical adenopathy Respiratory: Respiratory effort normal, Breath sounds clear A&P without wheeze, rhonchi, rales.   Cardio: RRR without murmurs, rubs or gallops. Brisk peripheral pulses without edema.  Chest: symmetric, with normal excursions Breasts: Deferred to obgyn  Abdomen: Soft, nontender, no guarding, rebound, hernias, masses, or organomegaly.  Lymphatics: Non tender without lymphadenopathy.  Genitourinary: Deferred to obgyn Musculoskeletal: Full ROM all peripheral extremities,5/5 strength, and normal gait.  Skin: Warm, dry without rashes, lesions, ecchymosis. Neuro: Awake and oriented X 3, Cranial nerves intact, reflexes equal bilaterally. Normal muscle tone, no cerebellar symptoms. Sensation intact.  Psych:  normal affect, Insight and Judgment appropriate.   EKG: WNL no changes.  AORTA SCAN: WNL   Over 40 minutes of exam, counseling, chart review and critical decision making was performed  Starlyn Skeans 9:51 AM [facility]  Ad[facility] Adolescent Internal Medicine  

## 2015-08-31 LAB — URINALYSIS, ROUTINE W REFLEX MICROSCOPIC
Bilirubin Urine: NEGATIVE
GLUCOSE, UA: NEGATIVE
HGB URINE DIPSTICK: NEGATIVE
Ketones, ur: NEGATIVE
LEUKOCYTES UA: NEGATIVE
NITRITE: NEGATIVE
PH: 6.5 (ref 5.0–8.0)
Protein, ur: NEGATIVE
Specific Gravity, Urine: 1.007 (ref 1.001–1.035)

## 2015-08-31 LAB — INSULIN, RANDOM: INSULIN: 7.9 u[IU]/mL (ref 2.0–19.6)

## 2015-08-31 LAB — MICROALBUMIN / CREATININE URINE RATIO: Creatinine, Urine: 22 mg/dL (ref 20–320)

## 2015-08-31 LAB — HEMOGLOBIN A1C
HEMOGLOBIN A1C: 6.2 % — AB (ref <5.7)
MEAN PLASMA GLUCOSE: 131 mg/dL

## 2015-08-31 LAB — VITAMIN D 25 HYDROXY (VIT D DEFICIENCY, FRACTURES): VIT D 25 HYDROXY: 33 ng/mL (ref 30–100)

## 2015-09-06 ENCOUNTER — Encounter: Payer: Self-pay | Admitting: Internal Medicine

## 2015-09-11 NOTE — Addendum Note (Signed)
Addended by: Ladon Vandenberghe A on: 09/11/2015 11:26 AM   Modules accepted: Orders

## 2015-10-04 ENCOUNTER — Encounter: Payer: Self-pay | Admitting: Internal Medicine

## 2015-10-04 ENCOUNTER — Ambulatory Visit (INDEPENDENT_AMBULATORY_CARE_PROVIDER_SITE_OTHER): Payer: BLUE CROSS/BLUE SHIELD | Admitting: Internal Medicine

## 2015-10-04 VITALS — BP 116/78 | HR 70 | Temp 98.2°F | Resp 18 | Ht 63.25 in | Wt 202.0 lb

## 2015-10-04 DIAGNOSIS — M6588 Other synovitis and tenosynovitis, other site: Secondary | ICD-10-CM | POA: Diagnosis not present

## 2015-10-04 DIAGNOSIS — M7750 Other enthesopathy of unspecified foot: Secondary | ICD-10-CM

## 2015-10-04 MED ORDER — MELOXICAM 15 MG PO TABS: 15.0000 mg | ORAL_TABLET | Freq: Every day | ORAL | 11 refills | Status: AC

## 2015-10-04 NOTE — Progress Notes (Signed)
   Subjective:    Patient ID: Heather Walton, female    DOB: 04-24-1960, 55 y.o.   MRN: [id number]  HPI  Patient presents to the office for evaluation of left foot pain x 1 month.  She reports that she has a lot of pain with narrow shoes.  She reports that she does admit the pain is intermittent.  She reports that the pain is a throbbing.  She has had some mild swelling.  She has tried epsom salt soaks.  She has never had an injury that she is aware of to it.    Review of Systems  Constitutional: Negative for chills, fatigue and fever.  Musculoskeletal: Positive for arthralgias, gait problem and joint swelling. Negative for myalgias.       Objective:   Physical Exam  Constitutional: She appears well-developed and well-nourished. No distress.  HENT:  Head: Normocephalic.  Mouth/Throat: Oropharynx is clear and moist. No oropharyngeal exudate.  Eyes: Conjunctivae are normal. No scleral icterus.  Neck: Normal range of motion. Neck supple.  Cardiovascular: Normal rate, regular rhythm, normal heart sounds and intact distal pulses.  Exam reveals no gallop and no friction rub.   No murmur heard. Pulmonary/Chest: Effort normal and breath sounds normal. No respiratory distress. She has no wheezes. She has no rales. She exhibits no tenderness.  Musculoskeletal:       Left ankle: She exhibits normal range of motion, no swelling, no ecchymosis, no deformity, no laceration and normal pulse. No lateral malleolus, no medial malleolus, no AITFL, no CF ligament, no posterior TFL, no head of 5th metatarsal and no proximal fibula tenderness found.       Feet:  Skin: She is not diaphoretic.  Nursing note and vitals reviewed.   Vitals:   10/04/15 1608  BP: 116/78  Pulse: 70  Resp: 18  Temp: 98.2 F (36.8 C)          Assessment & Plan:    1. Tendonitis of foot -likely hallicus longus tendonitis -stiff soled shoes with wide toebox. -mobic -if no relief may need to wear boot and have foot  and ankle referral

## 2015-10-04 NOTE — Patient Instructions (Signed)
Tendinitis °Tendinitis is swelling and inflammation of the tendons. Tendons are band-like tissues that connect muscle to bone. Tendinitis commonly occurs in the:  °· Shoulders (rotator cuff). °· Heels (Achilles tendon). °· Elbows (triceps tendon). °CAUSES °Tendinitis is usually caused by overusing the tendon, muscles, and joints involved. When the tissue surrounding a tendon (synovium) becomes inflamed, it is called tenosynovitis. Tendinitis commonly develops in people whose jobs require repetitive motions. °SYMPTOMS °· Pain. °· Tenderness. °· Mild swelling. °DIAGNOSIS °Tendinitis is usually diagnosed by physical exam. Your health care provider may also order X-rays or other imaging tests. °TREATMENT °Your health care provider may recommend certain medicines or exercises for your treatment. °HOME CARE INSTRUCTIONS  °· Use a sling or splint for as long as directed by your health care provider until the pain decreases. °· Put ice on the injured area. °¨ Put ice in a plastic bag. °¨ Place a towel between your skin and the bag. °¨ Leave the ice on for 15-20 minutes, 3-4 times a day, or as directed by your health care provider. °· Avoid using the limb while the tendon is painful. Perform gentle range of motion exercises only as directed by your health care provider. Stop exercises if pain or discomfort increase, unless directed otherwise by your health care provider. °· Only take over-the-counter or prescription medicines for pain, discomfort, or fever as directed by your health care provider. °SEEK MEDICAL CARE IF:  °· Your pain and swelling increase. °· You develop new, unexplained symptoms, especially increased numbness in the hands. °MAKE SURE YOU:  °· Understand these instructions. °· Will watch your condition. °· Will get help right away if you are not doing well or get worse. °  °This information is not intended to replace advice given to you by your health care provider. Make sure you discuss any questions you  have with your health care provider. °  °Document Released: 02/23/2000 Document Revised: 03/18/2014 Document Reviewed: 05/14/2010 °[employer]vier Interactive Patient Education ©2016 [employer] ° °

## 2015-11-14 DIAGNOSIS — N951 Menopausal and female climacteric states: Secondary | ICD-10-CM | POA: Diagnosis not present

## 2015-11-14 DIAGNOSIS — R875 Abnormal microbiological findings in specimens from female genital organs: Secondary | ICD-10-CM | POA: Diagnosis not present

## 2015-11-14 DIAGNOSIS — R3121 Asymptomatic microscopic hematuria: Secondary | ICD-10-CM | POA: Diagnosis not present

## 2015-11-14 DIAGNOSIS — Z01419 Encounter for gynecological examination (general) (routine) without abnormal findings: Secondary | ICD-10-CM | POA: Diagnosis not present

## 2015-11-14 DIAGNOSIS — Z1389 Encounter for screening for other disorder: Secondary | ICD-10-CM | POA: Diagnosis not present

## 2015-11-14 DIAGNOSIS — Z124 Encounter for screening for malignant neoplasm of cervix: Secondary | ICD-10-CM | POA: Diagnosis not present

## 2015-11-14 DIAGNOSIS — Z6835 Body mass index (BMI) 35.0-35.9, adult: Secondary | ICD-10-CM | POA: Diagnosis not present

## 2015-11-14 DIAGNOSIS — Z130 Encounter for screening for diseases of the blood and blood-forming organs and certain disorders involving the immune mechanism: Secondary | ICD-10-CM | POA: Diagnosis not present

## 2015-11-14 DIAGNOSIS — Z1151 Encounter for screening for human papillomavirus (HPV): Secondary | ICD-10-CM | POA: Diagnosis not present

## 2015-11-14 LAB — HM PAP SMEAR: HM Pap smear: NEGATIVE

## 2015-11-14 LAB — RESULTS CONSOLE HPV: CHL HPV: NEGATIVE

## 2015-11-24 DIAGNOSIS — R319 Hematuria, unspecified: Secondary | ICD-10-CM | POA: Diagnosis not present

## 2016-06-18 ENCOUNTER — Other Ambulatory Visit: Payer: Self-pay | Admitting: Internal Medicine

## 2016-06-18 DIAGNOSIS — Z1231 Encounter for screening mammogram for malignant neoplasm of breast: Secondary | ICD-10-CM

## 2016-07-23 ENCOUNTER — Encounter: Payer: Self-pay | Admitting: *Deleted

## 2016-08-06 ENCOUNTER — Ambulatory Visit
Admission: RE | Admit: 2016-08-06 | Discharge: 2016-08-06 | Disposition: A | Discharge status: Home / Self Care | Payer: BLUE CROSS/BLUE SHIELD | Source: Ambulatory Visit | Attending: Internal Medicine | Admitting: Internal Medicine

## 2016-08-06 DIAGNOSIS — Z1231 Encounter for screening mammogram for malignant neoplasm of breast: Secondary | ICD-10-CM | POA: Diagnosis not present

## 2016-08-29 ENCOUNTER — Encounter: Payer: Self-pay | Admitting: Internal Medicine

## 2016-09-18 NOTE — Progress Notes (Signed)
Complete Physical  Assessment and Plan:  Essential hypertension - continue medications, DASH diet, exercise and monitor at home. Call if greater than 130/80.  -     CBC with Differential/Platelet -     BASIC METABOLIC PANEL WITH GFR -     Hepatic function panel -     TSH -     Urinalysis, Routine w reflex microscopic -     Microalbumin / creatinine urine ratio  OSA (obstructive sleep apnea) Weight loss advised  Hyperlipidemia, unspecified hyperlipidemia type -continue medications, check lipids, decrease fatty foods, increase activity.  -     Lipid panel  Gastroesophageal reflux disease without esophagitis Continue PPI/H2 blocker, diet discussed  Encounter for general adult medical examination with abnormal findings  PreDM Discussed general issues about diabetes pathophysiology and management., Educational material distributed., Suggested low cholesterol diet., Encouraged aerobic exercise., Discussed foot care., Reminded to get yearly retinal exam. -     Hemoglobin A1c  Vitamin D deficiency -     VITAMIN D 25 Hydroxy (Vit-D Deficiency, Fractures)  Medication management -     Magnesium  Anemia, unspecified type -     Iron and TIBC -     Ferritin  rash -     clotrimazole-betamethasone (LOTRISONE) cream; Apply 1 application topically 2 (two) times daily.    Discussed med's effects and SE's. Screening labs and tests as requested with regular follow-up as recommended. Over 40 minutes of exam, counseling, chart review, and complex, high level critical decision making was performed this visit.   HPI  56 y.o. female  presents for a complete physical and follow up for has Hyperlipidemia; Hypertension; GERD (gastroesophageal reflux disease); OSA (obstructive sleep apnea); and Obesity on her problem list..  Her blood pressure has been controlled at home, today their BP is   She does workout, goes to gym and does recumbent bike at home. She denies chest pain, shortness of  breath, dizziness.   She is not on cholesterol medication and denies myalgias. Her cholesterol is at goal. The cholesterol last visit was:   Lab Results  Component Value Date   CHOL 161 08/30/2015   HDL 69 08/30/2015   LDLCALC 62 08/30/2015   TRIG 149 08/30/2015   CHOLHDL 2.3 08/30/2015   She has been working on diet and exercise for prediabetes,  and denies paresthesia of the feet, polydipsia, polyuria and visual disturbances. Last A1C in the office was:  Lab Results  Component Value Date   HGBA1C 6.2 (H) 08/30/2015   Lab Results  Component Value Date   GFRAA 70 08/30/2015   Patient is on Vitamin D supplement.   Lab Results  Component Value Date   VD25OH 33 08/30/2015     BMI is Body mass index is 36.73 kg/m., she is working on diet and exercise. Wt Readings from Last 3 Encounters:  09/19/16 209 lb (94.8 kg)  10/04/15 202 lb (91.6 kg)  08/30/15 202 lb (91.6 kg)    Current Medications:  Current Outpatient Prescriptions on File Prior to Visit  Medication Sig Dispense Refill  . Ascorbic Acid (VITAMIN C) 1000 MG tablet Take 1,000 mg by mouth daily.    Marland Kitchen aspirin 81 MG tablet Take 81 mg by mouth daily.    . Cholecalciferol (VITAMIN D-3) 5000 UNITS TABS Take 5,000 Units by mouth daily.    . clobetasol cream (TEMOVATE) 0.05 % Apply topically 2 (two) times daily. 30 g 1  . glucosamine-chondroitin 500-400 MG tablet Take 1 tablet by mouth  daily.    . Magnesium Chloride (MAGNESIUM DR PO) Take 250 mg by mouth daily.     . meloxicam (MOBIC) 15 MG tablet Take 1 tablet (15 mg total) by mouth daily. 30 tablet 11  . Multiple Vitamin (MULTIVITAMIN) tablet Take 1 tablet by mouth daily.    . vitamin E 400 UNIT capsule Take 400 Units by mouth daily.     No current facility-administered medications on file prior to visit.    Allergies:  Allergies  Allergen Reactions  . Levaquin [Levofloxacin In D5w] Nausea Only  . Maxitrol [Neomycin-Polymyxin-Dexameth]     Distorted patient's vision  for over a month  . Phentermine   . Prilosec [Omeprazole] Rash   Medical History:  She has Hyperlipidemia; Hypertension; GERD (gastroesophageal reflux disease); OSA (obstructive sleep apnea); and Obesity on her problem list.   Health Maintenance:   Immunization History  Administered Date(s) Administered  . Influenza-Unspecified 12/07/2013, 01/06/2015, 11/25/2015  . PPD Test 08/19/2013  . Tdap 01/12/2010   Tetanus: 2011 Pneumonia: N/A Prevnar due age 5 Shingles vaccine: Flu vaccine: 2017  Pap: 11/10/15, scheduled in sept MGM: 07/2015 Colonoscopy: 2014  Last Eye Exam: 11/16   Patient Care Team: Unk Pinto, MD as PCP - General (Internal Medicine) Deneise Lever, MD as Consulting Physician (Pulmonary Disease) Irene Shipper, MD as Consulting Physician (Gastroenterology) Lavonna Monarch, MD as Consulting Physician (Dermatology)  Surgical History:  She has a past surgical history that includes Diagnostic laparoscopy (1998). Family History:  Herfamily history includes Diabetes in her maternal grandfather, maternal grandmother, paternal grandfather, and paternal grandmother; Hypertension in her mother; Lung cancer in her father. Social History:  She reports that she has never smoked. She has never used smokeless tobacco. She reports that she does not drink alcohol or use drugs.  Review of Systems: Review of Systems  Constitutional: Negative.   HENT: Negative.   Eyes: Negative.   Respiratory: Negative.   Cardiovascular: Negative.   Gastrointestinal: Negative.   Genitourinary: Negative.   Musculoskeletal: Negative.   Skin: Positive for rash. Negative for itching.  Neurological: Negative.   Endo/Heme/Allergies: Negative.   Psychiatric/Behavioral: Negative.     Physical Exam: Estimated body mass index is 35.5 kg/m as calculated from the following:   Height as of 10/04/15: 5' 3.25" (1.607 m).   Weight as of 10/04/15: 202 lb (91.6 kg). There were no vitals taken for  this visit. General Appearance: Well nourished, in no apparent distress.  Eyes: PERRLA, EOMs, conjunctiva no swelling or erythema, normal fundi and vessels.  Sinuses: No Frontal/maxillary tenderness  ENT/Mouth: Ext aud canals clear, normal light reflex with TMs without erythema, bulging. Good dentition. No erythema, swelling, or exudate on post pharynx. Tonsils not swollen or erythematous. Hearing normal.  Neck: Supple, thyroid normal. No bruits  Respiratory: Respiratory effort normal, BS equal bilaterally without rales, rhonchi, wheezing or stridor.  Cardio: RRR without murmurs, rubs or gallops. Brisk peripheral pulses without edema.  Chest: symmetric, with normal excursions and percussion.  Breasts: defer GYN Abdomen: Soft, nontender, no guarding, rebound, hernias, masses, or organomegaly.  Lymphatics: Non tender without lymphadenopathy.  Genitourinary: defer Musculoskeletal: Full ROM all peripheral extremities,5/5 strength, and normal gait.  Skin: Warm, dry without rashes, lesions, ecchymosis. Neuro: Cranial nerves intact, reflexes equal bilaterally. Normal muscle tone, no cerebellar symptoms. Sensation intact.  Psych: Awake and oriented X 3, normal affect, Insight and Judgment appropriate.   EKG: defer. AORTA SCAN: defer  Vicie Mutters 1:29 PM [facility]

## 2016-09-19 ENCOUNTER — Ambulatory Visit (INDEPENDENT_AMBULATORY_CARE_PROVIDER_SITE_OTHER): Payer: BLUE CROSS/BLUE SHIELD | Admitting: Physician Assistant

## 2016-09-19 ENCOUNTER — Encounter: Payer: Self-pay | Admitting: Physician Assistant

## 2016-09-19 VITALS — BP 126/88 | HR 64 | Temp 97.5°F | Resp 16 | Ht 63.25 in | Wt 209.0 lb

## 2016-09-19 DIAGNOSIS — K219 Gastro-esophageal reflux disease without esophagitis: Secondary | ICD-10-CM

## 2016-09-19 DIAGNOSIS — E559 Vitamin D deficiency, unspecified: Secondary | ICD-10-CM | POA: Diagnosis not present

## 2016-09-19 DIAGNOSIS — G4733 Obstructive sleep apnea (adult) (pediatric): Secondary | ICD-10-CM

## 2016-09-19 DIAGNOSIS — Z79899 Other long term (current) drug therapy: Secondary | ICD-10-CM

## 2016-09-19 DIAGNOSIS — D649 Anemia, unspecified: Secondary | ICD-10-CM

## 2016-09-19 DIAGNOSIS — Z131 Encounter for screening for diabetes mellitus: Secondary | ICD-10-CM

## 2016-09-19 DIAGNOSIS — Z0000 Encounter for general adult medical examination without abnormal findings: Secondary | ICD-10-CM

## 2016-09-19 DIAGNOSIS — I10 Essential (primary) hypertension: Secondary | ICD-10-CM

## 2016-09-19 DIAGNOSIS — Z0001 Encounter for general adult medical examination with abnormal findings: Secondary | ICD-10-CM

## 2016-09-19 DIAGNOSIS — Z130 Encounter for screening for diseases of the blood and blood-forming organs and certain disorders involving the immune mechanism: Secondary | ICD-10-CM

## 2016-09-19 DIAGNOSIS — E785 Hyperlipidemia, unspecified: Secondary | ICD-10-CM

## 2016-09-19 MED ORDER — CLOTRIMAZOLE-BETAMETHASONE 1-0.05 % EX CREA: 1.0000 "application " | TOPICAL_CREAM | Freq: Two times a day (BID) | CUTANEOUS | 2 refills | Status: DC

## 2016-09-19 NOTE — Patient Instructions (Addendum)
Simple math prevails.    1st - exercise does not produce significant weight loss - at best one converts fat into muscle , "bulks up", loses inches, but usually stays "weight neutral"     2nd - think of your body weightas a check book: If you eat more calories than you burn up - you save money or gain weight .... Or if you spend more money than you put in the check book, ie burn up more calories than you eat, then you lose weight     3rd - if you walk or run 1 mile, you burn up 100 calories - you have to burn up 3,500 calories to lose 1 pound, ie you have to walk/run 35 miles to lose 1 measly pound. So if you want to lose 10 #, then you have to walk/run 350 miles, so.... clearly exercise is not the solution.     4. So if you consume 1,500 calories, then you have to burn up the equivalent of 15 miles to stay weight neutral - It also stands to reason that if you consume 1,500 cal/day and don't lose weight, then you must be burning up about 1,500 cals/day to stay weight neutral.     5. If you really want to lose weight, you must cut your calorie intake 300 calories /day and at that rate you should lose about 1 # every 3 days.   6. Please purchase Dr Joel Fuhrman's book(s) "The End of Dieting" & "Eat to Live" . It has some great concepts and recipes.      We want weight loss that will last so you should lose 1-2 pounds a week.  THAT IS IT! Please pick THREE things a month to change. Once it is a habit check off the item. Then pick another three items off the list to become habits.  If you are already doing a habit on the list GREAT!  Cross that item off! o Don't drink your calories. Ie, alcohol, soda, fruit juice, and sweet tea.  o Drink more water. Drink a glass when you feel hungry or before each meal.  o Eat breakfast - Complex carb and protein (likeDannon light and fit yogurt, oatmeal, fruit, eggs, turkey bacon). o Measure your cereal.  Eat no more than one cup a day. (ie Kashi) o Eat an apple  a day. o Add a vegetable a day. o Try a new vegetable a month. o Use Pam! Stop using oil or butter to cook. o Don't finish your plate or use smaller plates. o Share your dessert. o Eat sugar free Jello for dessert or frozen grapes. o Don't eat 2-3 hours before bed. o Switch to whole wheat bread, pasta, and brown rice. o Make healthier choices when you eat out. No fries! o Pick baked chicken, NOT fried. o Don't forget to SLOW DOWN when you eat. It is not going anywhere.  o Take the stairs. o Park far away in the parking lot o Lift soup cans (or weights) for 10 minutes while watching TV. o Walk at work for 10 minutes during break. o Walk outside 1 time a week with your friend, kids, dog, or significant other. o Start a walking group at church. o Walk the mall as much as you can tolerate.  o Keep a food diary. o Weigh yourself daily. o Walk for 15 minutes 3 days per week. o Cook at home more often and eat out less.  If life happens and you   back to old habits, it is okay.  Just start over. You can do it!   If you experience chest pain, get short of breath, or tired during the exercise, please stop immediately and inform your doctor.      Bad carbs also include fruit juice, alcohol, and sweet tea. These are empty calories that do not signal to your brain that you are full.   Please remember the good carbs are still carbs which convert into sugar. So please measure them out no more than 1/2-1 cup of rice, oatmeal, pasta, and beans  Veggies are however free foods! Pile them on.   Not all fruit is created equal. Please see the list below, the fruit at the bottom is higher in sugars than the fruit at the top. Please avoid all dried fruits.    Diabetes is a very complicated disease...lets simplify it.  An easy way to look at it to understand the complications is if you think of the extra sugar floating in your blood stream as glass shards floating through your blood stream.     Diabetes affects your small vessels first: 1) The glass shards (sugar) scraps down the tiny blood vessels in your eyes and lead to diabetic retinopathy, the leading cause of blindness in the [place]. Diabetes is the leading cause of newly diagnosed adult (84 to 56 years of age) blindness in the [place].  2) The glass shards scratches down the tiny vessels of your legs leading to nerve damage called neuropathy and can lead to amputations of your feet. More than 60% of all non-traumatic amputations of lower limbs occur in people with diabetes.  3) Over time the small vessels in your brain are shredded and closed off, individually this does not cause any problems but over a long period of time many of the small vessels being blocked can lead to Vascular Dementia.   4) Your kidney's are a filter system and have a "net" that keeps certain things in the body and lets bad things out. Sugar shreds this net and leads to kidney damage and eventually failure. Decreasing the sugar that is destroying the net and certain blood pressure medications can help stop or decrease progression of kidney disease. Diabetes was the primary cause of kidney failure in 44 percent of all new cases in 2011.  5) Diabetes also destroys the small vessels in your penis that lead to erectile dysfunction. Eventually the vessels are so damaged that you may not be responsive to cialis or viagra.   Diabetes and your large vessels: Your larger vessels consist of your coronary arteries in your heart and the carotid vessels to your brain. Diabetes or even increased sugars put you at 300% increased risk of heart attack and stroke and this is why.. The sugar scrapes down your large blood vessels and your body sees this as an internal injury and tries to repair itself. Just like you get a scab on your skin, your platelets will stick to the blood vessel wall trying to heal it. This is why we have diabetics on low dose aspirin daily, this  prevents the platelets from sticking and can prevent plaque formation. In addition, your body takes cholesterol and tries to shove it into the open wound. This is why we want your LDL, or bad cholesterol, below 70.   The combination of platelets and cholesterol over 5-10 years forms plaque that can break off and cause a heart attack or stroke.   PLEASE REMEMBER:  Diabetes is preventable! Up to 41 percent of complications and morbidities among individuals with type 2 diabetes can be prevented, delayed, or effectively treated and minimized with regular visits to a health professional, appropriate monitoring and medication, and a healthy diet and lifestyle.

## 2016-09-20 LAB — BASIC METABOLIC PANEL WITH GFR
BUN: 15 mg/dL (ref 7–25)
CO2: 25 mmol/L (ref 20–31)
CREATININE: 0.94 mg/dL (ref 0.50–1.05)
Calcium: 9.7 mg/dL (ref 8.6–10.4)
Chloride: 105 mmol/L (ref 98–110)
GFR, EST NON AFRICAN AMERICAN: 68 mL/min (ref >=60)
GFR, Est African American: 78 mL/min (ref >=60)
Glucose, Bld: 86 mg/dL (ref 65–99)
POTASSIUM: 4.3 mmol/L (ref 3.5–5.3)
SODIUM: 140 mmol/L (ref 135–146)

## 2016-09-20 LAB — MICROALBUMIN / CREATININE URINE RATIO
CREATININE, URINE: 99 mg/dL (ref 20–320)
MICROALB UR: 0.7 mg/dL
Microalb Creat Ratio: 7 mcg/mg creat (ref <30)

## 2016-09-20 LAB — CBC WITH DIFFERENTIAL/PLATELET
BASOS PCT: 1 %
Basophils Absolute: 73 cells/uL (ref 0–200)
EOS PCT: 1 %
Eosinophils Absolute: 73 cells/uL (ref 15–500)
HCT: 41.8 % (ref 35.0–45.0)
HEMOGLOBIN: 13.7 g/dL (ref 11.7–15.5)
LYMPHS ABS: 3139 {cells}/uL (ref 850–3900)
Lymphocytes Relative: 43 %
MCH: 30.3 pg (ref 27.0–33.0)
MCHC: 32.8 g/dL (ref 32.0–36.0)
MCV: 92.5 fL (ref 80.0–100.0)
MONOS PCT: 5 %
MPV: 9.9 fL (ref 7.5–12.5)
Monocytes Absolute: 365 cells/uL (ref 200–950)
NEUTROS ABS: 3650 {cells}/uL (ref 1500–7800)
Neutrophils Relative %: 50 %
PLATELETS: 261 10*3/uL (ref 140–400)
RBC: 4.52 MIL/uL (ref 3.80–5.10)
RDW: 13.1 % (ref 11.0–15.0)
WBC: 7.3 10*3/uL (ref 3.8–10.8)

## 2016-09-20 LAB — IRON AND TIBC
%SAT: 30 % (ref 11–50)
Iron: 95 ug/dL (ref 45–160)
TIBC: 312 ug/dL (ref 250–450)
UIBC: 217 ug/dL

## 2016-09-20 LAB — TSH: TSH: 0.57 m[IU]/L

## 2016-09-20 LAB — URINALYSIS, ROUTINE W REFLEX MICROSCOPIC
Bilirubin Urine: NEGATIVE
Glucose, UA: NEGATIVE
Hgb urine dipstick: NEGATIVE
KETONES UR: NEGATIVE
Leukocytes, UA: NEGATIVE
NITRITE: NEGATIVE
Protein, ur: NEGATIVE
SPECIFIC GRAVITY, URINE: 1.015 (ref 1.001–1.035)
pH: 6 (ref 5.0–8.0)

## 2016-09-20 LAB — MAGNESIUM: Magnesium: 2.1 mg/dL (ref 1.5–2.5)

## 2016-09-20 LAB — HEPATIC FUNCTION PANEL
ALT: 17 U/L (ref 6–29)
AST: 17 U/L (ref 10–35)
Albumin: 4.2 g/dL (ref 3.6–5.1)
Alkaline Phosphatase: 55 U/L (ref 33–130)
BILIRUBIN DIRECT: 0.1 mg/dL (ref <=0.2)
BILIRUBIN TOTAL: 0.3 mg/dL (ref 0.2–1.2)
Indirect Bilirubin: 0.2 mg/dL (ref 0.2–1.2)
Total Protein: 7.3 g/dL (ref 6.1–8.1)

## 2016-09-20 LAB — FERRITIN: Ferritin: 57 ng/mL (ref 10–232)

## 2016-09-20 LAB — VITAMIN D 25 HYDROXY (VIT D DEFICIENCY, FRACTURES): Vit D, 25-Hydroxy: 29 ng/mL — ABNORMAL LOW (ref 30–100)

## 2016-09-20 LAB — LIPID PANEL
CHOL/HDL RATIO: 2.7 ratio (ref <5.0)
CHOLESTEROL: 170 mg/dL (ref <200)
HDL: 62 mg/dL (ref >50)
LDL Cholesterol: 81 mg/dL (ref <100)
TRIGLYCERIDES: 133 mg/dL (ref <150)
VLDL: 27 mg/dL (ref <30)

## 2016-09-20 LAB — HEMOGLOBIN A1C
Hgb A1c MFr Bld: 6.1 % — ABNORMAL HIGH (ref <5.7)
MEAN PLASMA GLUCOSE: 128 mg/dL

## 2016-10-24 ENCOUNTER — Encounter: Payer: Self-pay | Admitting: Physician Assistant

## 2016-10-24 MED ORDER — PREDNISONE 20 MG PO TABS: ORAL_TABLET | ORAL | 0 refills | Status: DC

## 2016-11-07 ENCOUNTER — Ambulatory Visit (INDEPENDENT_AMBULATORY_CARE_PROVIDER_SITE_OTHER): Payer: BLUE CROSS/BLUE SHIELD | Admitting: Physician Assistant

## 2016-11-07 VITALS — BP 122/84 | HR 72 | Temp 97.0°F | Resp 16 | Ht 63.25 in | Wt 211.2 lb

## 2016-11-07 DIAGNOSIS — R42 Dizziness and giddiness: Secondary | ICD-10-CM | POA: Diagnosis not present

## 2016-11-07 MED ORDER — AZITHROMYCIN 250 MG PO TABS: ORAL_TABLET | ORAL | 1 refills | Status: AC

## 2016-11-07 NOTE — Patient Instructions (Signed)
Your ears and sinuses are connected by the eustachian tube. When your sinuses are inflamed, this can close off the tube and cause fluid to collect in your middle ear. This can then cause dizziness, popping, clicking, ringing, and echoing in your ears. This is often NOT an infection and does NOT require antibiotics, it is caused by inflammation so the treatments help the inflammation. This can take a long time to get better so please be patient.  Here are things you can do to help with this: - Try the Flonase or Nasonex. Remember to spray each nostril twice towards the outer part of your eye.  Do not sniff but instead pinch your nose and tilt your head back to help the medicine get into your sinuses.  The best time to do this is at bedtime.Stop if you get blurred vision or nose bleeds.  -While drinking fluids, pinch and hold nose close and swallow, to help open eustachian tubes to drain fluid behind ear drums. -Please pick one of the over the counter allergy medications below and take it once daily for allergies.  It will also help with fluid behind ear drums. Claritin or loratadine cheapest but likely the weakest  Zyrtec or certizine at night because it can make you sleepy The strongest is allegra or fexafinadine  Cheapest at [employer], [employer], [employer] -can use decongestant over the counter, please do not use if you have high blood pressure or certain heart conditions.   if worsening HA, changes vision/speech, imbalance, weakness go to the ER    Dizziness Dizziness is a common problem. It is a feeling of unsteadiness or light-headedness. You may feel like you are about to faint. Dizziness can lead to injury if you stumble or fall. Anyone can become dizzy, but dizziness is more common in older adults. This condition can be caused by a number of things, including medicines, dehydration, or illness. Follow these instructions at home: Taking these steps may help with your condition: Eating and  drinking  Drink enough fluid to keep your urine clear or pale yellow. This helps to keep you from becoming dehydrated. Try to drink more clear fluids, such as water.  Do not drink alcohol.  Limit your caffeine intake if directed by your health care provider.  Limit your salt intake if directed by your health care provider. Activity  Avoid making quick movements. ? Rise slowly from chairs and steady yourself until you feel okay. ? In the morning, first sit up on the side of the bed. When you feel okay, stand slowly while you hold onto something until you know that your balance is fine.  Move your legs often if you need to stand in one place for a long time. Tighten and relax your muscles in your legs while you are standing.  Do not drive or operate heavy machinery if you feel dizzy.  Avoid bending down if you feel dizzy. Place items in your home so that they are easy for you to reach without leaning over. Lifestyle  Do not use any tobacco products, including cigarettes, chewing tobacco, or electronic cigarettes. If you need help quitting, ask your health care provider.  Try to reduce your stress level, such as with yoga or meditation. Talk with your health care provider if you need help. General instructions  Watch your dizziness for any changes.  Take medicines only as directed by your health care provider. Talk with your health care provider if you think that your dizziness is caused by  a medicine that you are taking.  Tell a friend or a family member that you are feeling dizzy. If he or she notices any changes in your behavior, have this person call your health care provider.  Keep all follow-up visits as directed by your health care provider. This is important. Contact a health care provider if:  Your dizziness does not go away.  Your dizziness or light-headedness gets worse.  You feel nauseous.  You have reduced hearing.  You have new symptoms.  You are unsteady on  your feet or you feel like the room is spinning. Get help right away if:  You vomit or have diarrhea and are unable to eat or drink anything.  You have problems talking, walking, swallowing, or using your arms, hands, or legs.  You feel generally weak.  You are not thinking clearly or you have trouble forming sentences. It may take a friend or family member to notice this.  You have chest pain, abdominal pain, shortness of breath, or sweating.  Your vision changes.  You notice any bleeding.  You have a headache.  You have neck pain or a stiff neck.  You have a fever. This information is not intended to replace advice given to you by your health care provider. Make sure you discuss any questions you have with your health care provider. Document Released: 08/21/2000 Document Revised: 08/03/2015 Document Reviewed: 02/21/2014 Elsevier Interactive Patient Education  2017 [employer].

## 2016-11-07 NOTE — Progress Notes (Signed)
Subjective:    Patient ID: Heather Walton, female    DOB: 01-Nov-1960, 56 y.o.   MRN: [id number]  HPI 56 y.o. AAF presents with dizziness.  She had a bad sinus headache/dizziness on 08/16, given prednisone and allergy pill. Feeling a lot better but still having little bit of frontal pressure and dizziness with standing.  No fever, chills, no changes in vision, no imbalance, no sinus symptoms, no changes in hearing, ringing in ears.   Blood pressure 122/84, pulse 72, temperature (!) 97 F (36.1 C), resp. rate 16, height 5' 3.25" (1.607 m), weight 211 lb 3.2 oz (95.8 kg). ' Medications Current Outpatient Prescriptions on File Prior to Visit  Medication Sig  . Ascorbic Acid (VITAMIN C) 1000 MG tablet Take 1,000 mg by mouth daily.  Marland Kitchen aspirin 81 MG tablet Take 81 mg by mouth daily.  . Cholecalciferol (VITAMIN D-3) 5000 UNITS TABS Take 5,000 Units by mouth daily.  . clotrimazole-betamethasone (LOTRISONE) cream Apply 1 application topically 2 (two) times daily.  Marland Kitchen glucosamine-chondroitin 500-400 MG tablet Take 1 tablet by mouth daily.  . Magnesium Chloride (MAGNESIUM DR PO) Take 250 mg by mouth daily.   . Multiple Vitamin (MULTIVITAMIN) tablet Take 1 tablet by mouth daily.  . vitamin E 400 UNIT capsule Take 400 Units by mouth daily.   No current facility-administered medications on file prior to visit.     Problem list She has Hyperlipidemia; Hypertension; GERD (gastroesophageal reflux disease); OSA (obstructive sleep apnea); and Obesity on her problem list.   Review of Systems  Constitutional: Negative for chills and diaphoresis.  HENT: Positive for congestion, postnasal drip, sinus pressure and sneezing. Negative for ear pain and sore throat.   Respiratory: Positive for cough. Negative for chest tightness, shortness of breath and wheezing.   Cardiovascular: Negative.   Gastrointestinal: Negative.   Genitourinary: Negative.   Musculoskeletal: Negative for neck pain.  Neurological:  Positive for headaches.       Objective:   Physical Exam  Constitutional: She appears well-developed and well-nourished.  HENT:  Head: Normocephalic and atraumatic.  Right Ear: External ear normal.  Nose: Right sinus exhibits maxillary sinus tenderness. Right sinus exhibits no frontal sinus tenderness. Left sinus exhibits maxillary sinus tenderness. Left sinus exhibits no frontal sinus tenderness.  Eyes: Conjunctivae and EOM are normal.  Neck: Normal range of motion. Neck supple.  Cardiovascular: Normal rate, regular rhythm, normal heart sounds and intact distal pulses.   Pulmonary/Chest: Effort normal and breath sounds normal. No respiratory distress. She has no wheezes.  Abdominal: Soft. Bowel sounds are normal.  Lymphadenopathy:    She has no cervical adenopathy.  Skin: Skin is warm and dry.       Assessment & Plan:    Dizziness Likely from sinuses, normal neuro, no red flags -     CBC with Differential/Platelet -     BASIC METABOLIC PANEL WITH GFR -     Hepatic function panel -     azithromycin (ZITHROMAX) 250 MG tablet; Take 2 tablets (500 mg) on  Day 1,  followed by 1 tablet (250 mg) once daily on Days 2 through 5.  The patient was advised to call immediately if she has any concerning symptoms in the interval. The patient voices understanding of current treatment options and is in agreement with the current care plan.The patient knows to call the clinic with any problems, questions or concerns or go to the ER if any further progression of symptoms.

## 2016-11-08 LAB — BASIC METABOLIC PANEL WITH GFR
BUN: 14 mg/dL (ref 7–25)
CHLORIDE: 107 mmol/L (ref 98–110)
CO2: 28 mmol/L (ref 20–32)
Calcium: 9.7 mg/dL (ref 8.6–10.4)
Creat: 0.96 mg/dL (ref 0.50–1.05)
GFR, EST AFRICAN AMERICAN: 77 mL/min/{1.73_m2} (ref >=60)
GFR, Est Non African American: 66 mL/min/{1.73_m2} (ref >=60)
Glucose, Bld: 101 mg/dL — ABNORMAL HIGH (ref 65–99)
POTASSIUM: 4.1 mmol/L (ref 3.5–5.3)
SODIUM: 141 mmol/L (ref 135–146)

## 2016-11-08 LAB — CBC WITH DIFFERENTIAL/PLATELET
BASOS PCT: 0.7 %
Basophils Absolute: 59 cells/uL (ref 0–200)
EOS ABS: 84 {cells}/uL (ref 15–500)
Eosinophils Relative: 1 %
HCT: 37.9 % (ref 35.0–45.0)
Hemoglobin: 12.8 g/dL (ref 11.7–15.5)
Lymphs Abs: 3360 cells/uL (ref 850–3900)
MCH: 30 pg (ref 27.0–33.0)
MCHC: 33.8 g/dL (ref 32.0–36.0)
MCV: 88.8 fL (ref 80.0–100.0)
MONOS PCT: 7.8 %
MPV: 10.1 fL (ref 7.5–12.5)
Neutro Abs: 4242 cells/uL (ref 1500–7800)
Neutrophils Relative %: 50.5 %
PLATELETS: 241 10*3/uL (ref 140–400)
RBC: 4.27 10*6/uL (ref 3.80–5.10)
RDW: 12.2 % (ref 11.0–15.0)
TOTAL LYMPHOCYTE: 40 %
WBC mixed population: 655 cells/uL (ref 200–950)
WBC: 8.4 10*3/uL (ref 3.8–10.8)

## 2016-11-08 LAB — HEPATIC FUNCTION PANEL
AG Ratio: 1.5 (calc) (ref 1.0–2.5)
ALKALINE PHOSPHATASE (APISO): 52 U/L (ref 33–130)
ALT: 24 U/L (ref 6–29)
AST: 17 U/L (ref 10–35)
Albumin: 4.1 g/dL (ref 3.6–5.1)
BILIRUBIN DIRECT: 0 mg/dL (ref 0.0–0.2)
BILIRUBIN INDIRECT: 0.2 mg/dL (ref 0.2–1.2)
Globulin: 2.7 g/dL (calc) (ref 1.9–3.7)
Total Bilirubin: 0.2 mg/dL (ref 0.2–1.2)
Total Protein: 6.8 g/dL (ref 6.1–8.1)

## 2016-11-10 ENCOUNTER — Encounter: Payer: Self-pay | Admitting: Physician Assistant

## 2016-11-14 DIAGNOSIS — Z01419 Encounter for gynecological examination (general) (routine) without abnormal findings: Secondary | ICD-10-CM | POA: Diagnosis not present

## 2016-11-14 DIAGNOSIS — Z1389 Encounter for screening for other disorder: Secondary | ICD-10-CM | POA: Diagnosis not present

## 2016-11-14 DIAGNOSIS — Z130 Encounter for screening for diseases of the blood and blood-forming organs and certain disorders involving the immune mechanism: Secondary | ICD-10-CM | POA: Diagnosis not present

## 2016-11-14 DIAGNOSIS — Z6837 Body mass index (BMI) 37.0-37.9, adult: Secondary | ICD-10-CM | POA: Diagnosis not present

## 2016-11-14 DIAGNOSIS — N951 Menopausal and female climacteric states: Secondary | ICD-10-CM | POA: Diagnosis not present

## 2017-02-16 ENCOUNTER — Encounter: Payer: Self-pay | Admitting: Physician Assistant

## 2017-02-17 ENCOUNTER — Other Ambulatory Visit: Payer: Self-pay | Admitting: Internal Medicine

## 2017-02-17 ENCOUNTER — Encounter (INDEPENDENT_AMBULATORY_CARE_PROVIDER_SITE_OTHER): Payer: Self-pay

## 2017-02-17 MED ORDER — PREDNISONE 20 MG PO TABS: ORAL_TABLET | ORAL | 0 refills | Status: DC

## 2017-02-17 MED ORDER — AZITHROMYCIN 250 MG PO TABS: ORAL_TABLET | ORAL | 1 refills | Status: DC

## 2017-02-27 ENCOUNTER — Encounter: Payer: Self-pay | Admitting: Physician Assistant

## 2017-04-23 DIAGNOSIS — L304 Erythema intertrigo: Secondary | ICD-10-CM | POA: Diagnosis not present

## 2017-04-23 DIAGNOSIS — L259 Unspecified contact dermatitis, unspecified cause: Secondary | ICD-10-CM | POA: Diagnosis not present

## 2017-06-21 IMAGING — MG DIGITAL SCREENING BILATERAL MAMMOGRAM WITH CAD
4 series · 4 of 4 positions shown · non-contrast
Comparison: Previous exam(s).

CLINICAL DATA: Screening.

EXAM:
DIGITAL SCREENING BILATERAL MAMMOGRAM WITH CAD

[R CC]
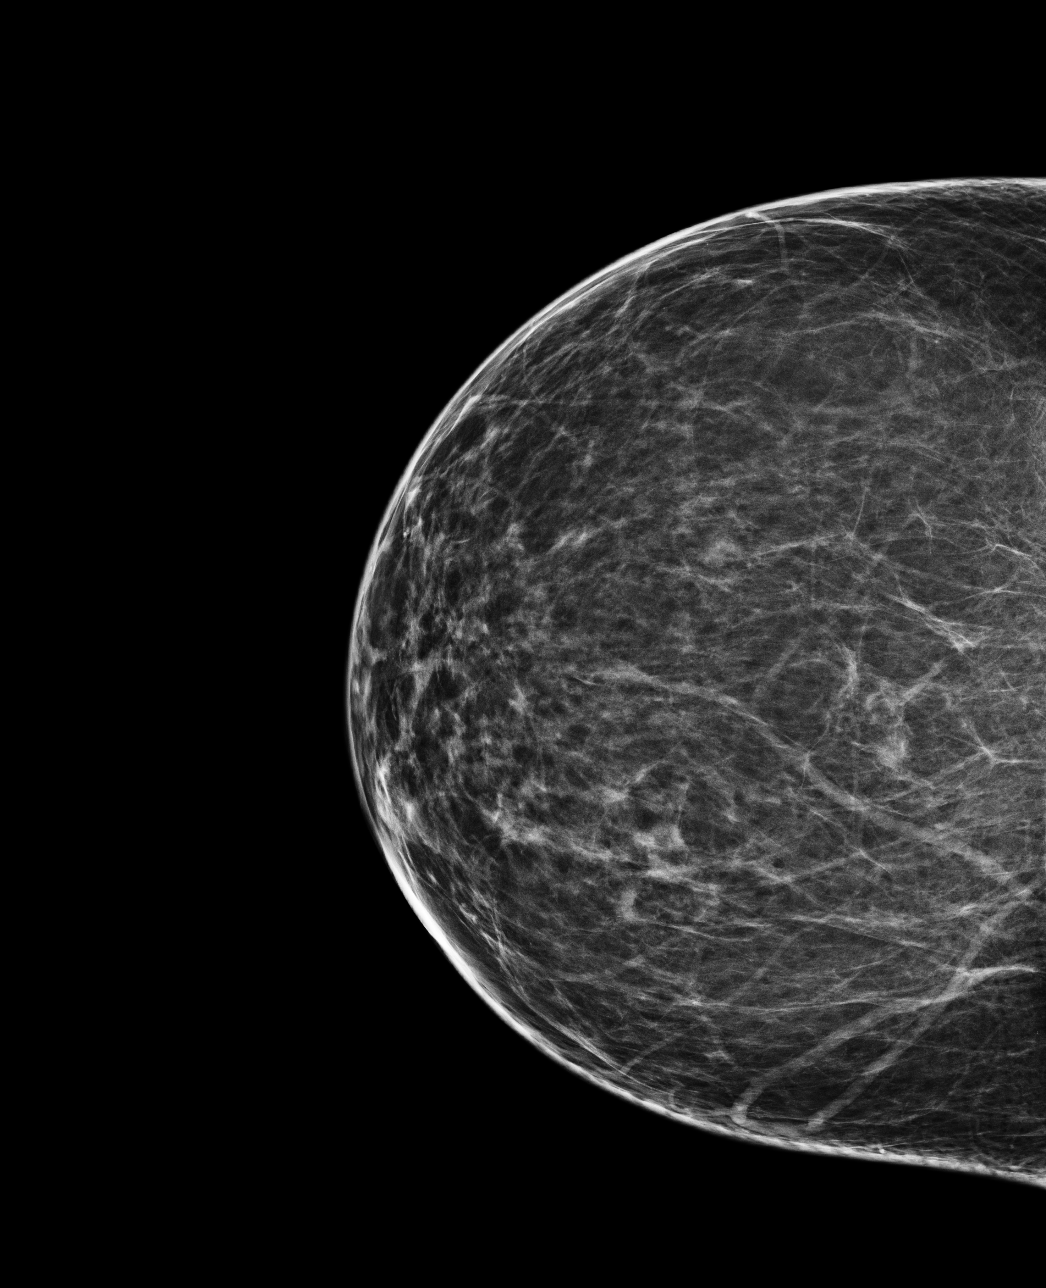

[L CC]
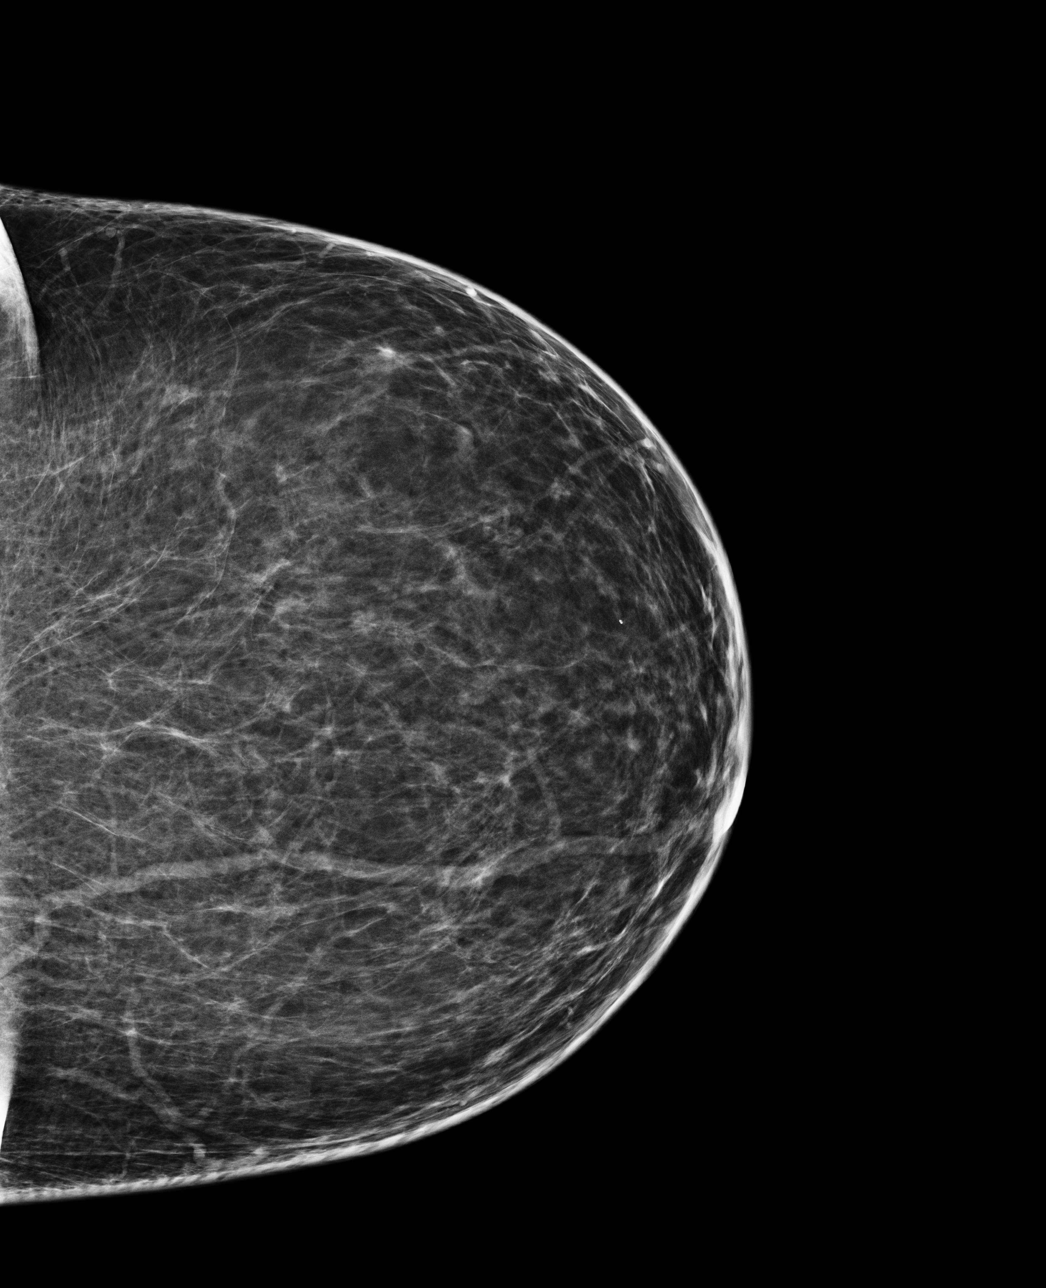

[L MLO]
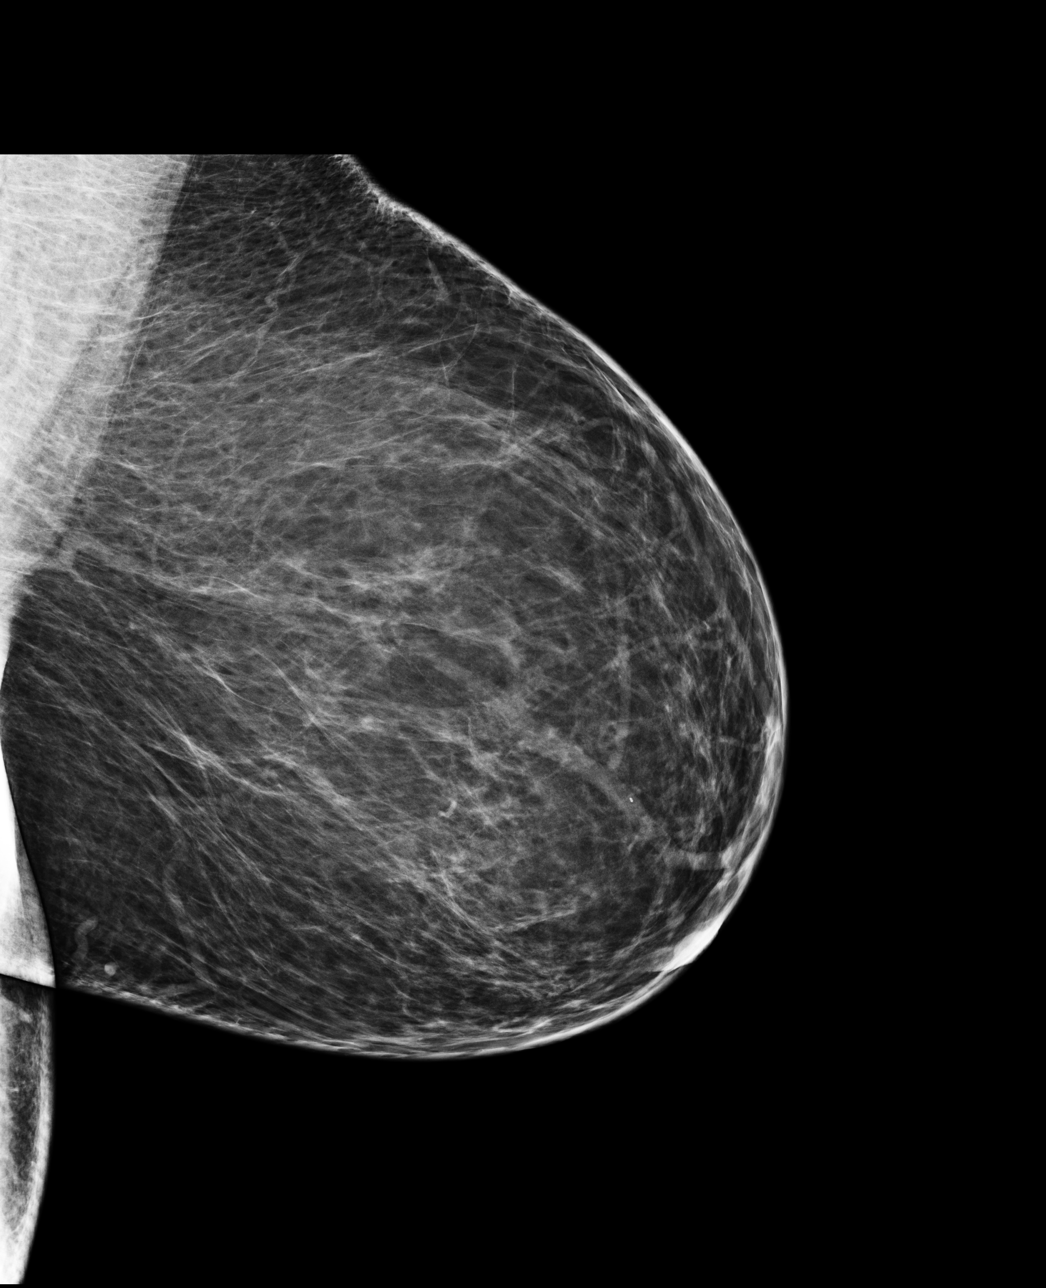

[R MLO]
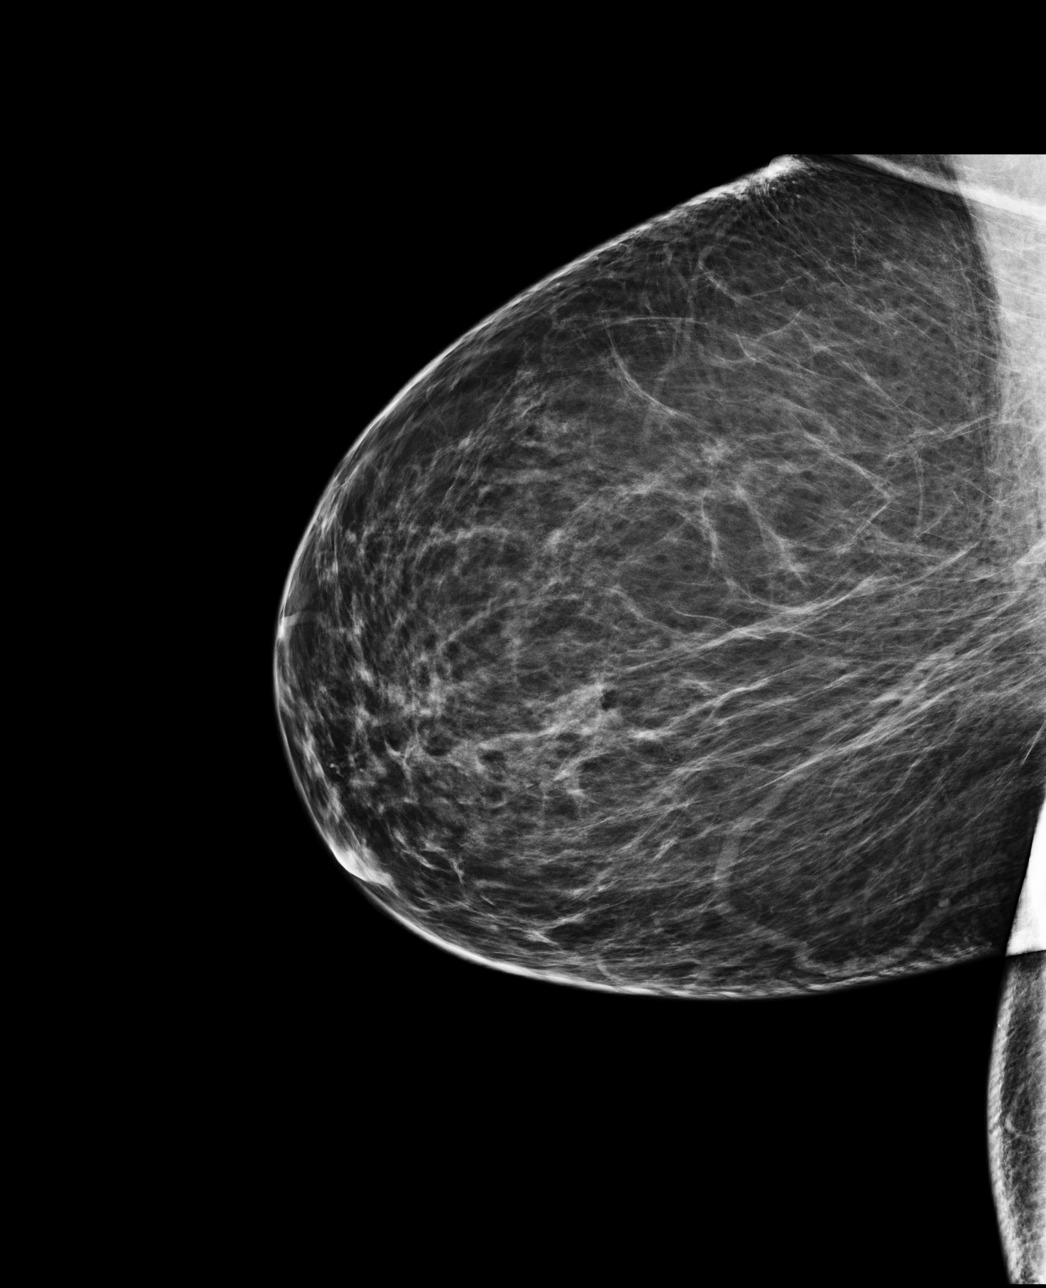

[4 of 4 positions shown; findings below may reference images not displayed]

ACR Breast Density Category b: There are scattered areas of
fibroglandular density.
FINDINGS: There are no findings suspicious for malignancy. Images were
processed with CAD.
IMPRESSION: No mammographic evidence of malignancy. A result letter of this
screening mammogram will be mailed directly to the patient.

RECOMMENDATION:
Screening mammogram in one year. (Code:AS-G-LCT)

BI-RADS CATEGORY  1: Negative.

## 2017-06-27 ENCOUNTER — Other Ambulatory Visit: Payer: Self-pay | Admitting: Internal Medicine

## 2017-06-27 DIAGNOSIS — Z1231 Encounter for screening mammogram for malignant neoplasm of breast: Secondary | ICD-10-CM

## 2017-08-14 ENCOUNTER — Ambulatory Visit
Admission: RE | Admit: 2017-08-14 | Discharge: 2017-08-14 | Disposition: A | Discharge status: Home / Self Care | Payer: BLUE CROSS/BLUE SHIELD | Source: Ambulatory Visit | Attending: Internal Medicine | Admitting: Internal Medicine

## 2017-08-14 DIAGNOSIS — Z1231 Encounter for screening mammogram for malignant neoplasm of breast: Secondary | ICD-10-CM | POA: Diagnosis not present

## 2017-09-23 ENCOUNTER — Encounter: Payer: Self-pay | Admitting: Physician Assistant

## 2017-09-29 DIAGNOSIS — Z860101 Personal history of adenomatous and serrated colon polyps: Secondary | ICD-10-CM | POA: Insufficient documentation

## 2017-09-29 DIAGNOSIS — Z86010 Personal history of colonic polyps: Secondary | ICD-10-CM | POA: Insufficient documentation

## 2017-09-29 NOTE — Progress Notes (Signed)
Complete Physical  Assessment and Plan:  Encounter for general adult medical examination with abnormal findings  Essential hypertension - continue medications, DASH diet, exercise and monitor at home. Call if greater than 130/80.  -     CBC with Differential/Platelet -     CMP/GFR -     TSH -     Urinalysis, Routine w reflex microscopic -     Microalbumin / creatinine urine ratio -     EKG  OSA (obstructive sleep apnea) Weight loss advised  Hyperlipidemia, unspecified hyperlipidemia type -continue medications, check lipids, decrease fatty foods, increase activity.  -     Lipid panel  Gastroesophageal reflux disease without esophagitis Continue PPI/H2 blocker, diet discussed  PreDM Discussed general issues about diabetes pathophysiology and management., Educational material distributed., Suggested low cholesterol diet., Encouraged aerobic exercise., Discussed foot care., Reminded to get yearly retinal exam. -     Hemoglobin A1c  Morbid obesity Long discussion about weight loss, diet, and exercise Recommended diet heavy in fruits and veggies and low in animal meats, cheeses, and dairy products, appropriate calorie intake Discussed appropriate weight for height  Follow up at next visit  Vitamin D deficiency -     VITAMIN D 25 Hydroxy (Vit-D Deficiency, Fractures)  Medication management -     Magnesium  Anemia, unspecified type -     Vitamin B12 level  History of adenomatous polyp of colon Due this August, she will schedule or call if needs help coordinating  Discussed med's effects and SE's. Screening labs and tests as requested with regular follow-up as recommended. Over 40 minutes of exam, counseling, chart review, and complex, high level critical decision making was performed this visit.   Future Appointments  Date Time Provider [place]  09/30/2017  3:00 PM Liane Comber, NP [facility] None  10/01/2018  3:00 PM Liane Comber, NP [facility] None      HPI  57 y.o. female  presents for a complete physical and follow up for has Hyperlipidemia; Hypertension; GERD (gastroesophageal reflux disease); OSA (obstructive sleep apnea); Morbid obesity ([place]); and History of adenomatous polyp of colon on their problem list. She works as Therapist, art on phone. She sees GYN annually.  BMI is Body mass index is 36.73 kg/m., she has been working on diet and exercise. Wt Readings from Last 3 Encounters:  09/30/17 209 lb (94.8 kg)  11/07/16 211 lb 3.2 oz (95.8 kg)  09/19/16 209 lb (94.8 kg)   Her blood pressure has been controlled at home, today their BP is BP: 120/80 She does workout, goes to gym and does recumbent bike at home. She denies chest pain, shortness of breath, dizziness.   She is not on cholesterol medication and denies myalgias. Her cholesterol is at goal. The cholesterol last visit was:   Lab Results  Component Value Date   CHOL 170 09/19/2016   HDL 62 09/19/2016   LDLCALC 81 09/19/2016   TRIG 133 09/19/2016   CHOLHDL 2.7 09/19/2016   She has been working on diet and exercise for prediabetes,  and denies paresthesia of the feet, polydipsia, polyuria and visual disturbances. Last A1C in the office was:  Lab Results  Component Value Date   HGBA1C 6.1 (H) 09/19/2016   Lab Results  Component Value Date   GFRAA 77 11/07/2016   Patient is on Vitamin D supplement.   Lab Results  Component Value Date   VD25OH 29 (L) 09/19/2016      Current Medications:  Current Outpatient Medications on  File Prior to Visit  Medication Sig Dispense Refill  . Ascorbic Acid (VITAMIN C) 1000 MG tablet Take 1,000 mg by mouth daily.    Marland Kitchen aspirin 81 MG tablet Take 81 mg by mouth daily.    Marland Kitchen azithromycin (ZITHROMAX) 250 MG tablet Take 2 tablets (500 mg) on  Day 1,  followed by 1 tablet (250 mg) once daily on Days 2 through 5. 6 each 1  . Cholecalciferol (VITAMIN D-3) 5000 UNITS TABS Take 5,000 Units by mouth daily.    .  clotrimazole-betamethasone (LOTRISONE) cream Apply 1 application topically 2 (two) times daily. 15 g 2  . glucosamine-chondroitin 500-400 MG tablet Take 1 tablet by mouth daily.    . Magnesium Chloride (MAGNESIUM DR PO) Take 250 mg by mouth daily.     . Multiple Vitamin (MULTIVITAMIN) tablet Take 1 tablet by mouth daily.    . predniSONE (DELTASONE) 20 MG tablet 1 tab 3 x day for 2 days, then 1 tab 2 x day for 2 days, then 1 tab 1 x day for 3 days 13 tablet 0  . vitamin E 400 UNIT capsule Take 400 Units by mouth daily.     No current facility-administered medications on file prior to visit.    Allergies:  Allergies  Allergen Reactions  . Levaquin [Levofloxacin In D5w] Nausea Only  . Maxitrol [Neomycin-Polymyxin-Dexameth]     Distorted patient's vision for over a month  . Phentermine   . Prilosec [Omeprazole] Rash   Medical History:  She has Hyperlipidemia; Hypertension; GERD (gastroesophageal reflux disease); OSA (obstructive sleep apnea); Morbid obesity ([place]); and History of adenomatous polyp of colon on their problem list.   Health Maintenance:   Immunization History  Administered Date(s) Administered  . Influenza-Unspecified 12/07/2013, 01/06/2015, 11/25/2015  . PPD Test 08/19/2013  . Tdap 01/12/2010   Tetanus: 2011 Pneumonia: N/A Prevnar due age 78 Shingles vaccine: -  Flu vaccine: 2017  Pap: 11/09/16,  Has annually at OBGYN MGM: 08/2017 Colonoscopy: 2014, due follow up 10/2017 - will schedule  Last Eye Exam: [facility], 01/2017, contacts/glasses Last dental exam: Dental works on [employer], 06/2017, goes q33m  Patient Care Team: Unk Pinto, MD as PCP - General (Internal Medicine) Deneise Lever, MD as Consulting Physician (Pulmonary Disease) Irene Shipper, MD as Consulting Physician (Gastroenterology) Lavonna Monarch, MD as Consulting Physician (Dermatology)  Surgical History:  She has a past surgical history that includes Diagnostic laparoscopy  (1998). Family History:  Herfamily history includes Dementia (age of onset: 76) in her mother; Diabetes in her maternal grandfather, maternal grandmother, paternal grandfather, and paternal grandmother; Hypertension in her mother; Lung cancer (age of onset: 76) in her father. Social History:  She reports that she has never smoked. She has never used smokeless tobacco. She reports that she does not drink alcohol or use drugs.  Review of Systems: Review of Systems  Constitutional: Negative.  Negative for malaise/fatigue and weight loss.  HENT: Negative.  Negative for hearing loss and tinnitus.   Eyes: Negative.  Negative for blurred vision and double vision.  Respiratory: Negative.  Negative for cough, sputum production, shortness of breath and wheezing.   Cardiovascular: Negative.  Negative for chest pain, palpitations, orthopnea, claudication, leg swelling and PND.  Gastrointestinal: Negative.  Negative for abdominal pain, blood in stool, constipation, diarrhea, heartburn, melena, nausea and vomiting.  Genitourinary: Negative.   Musculoskeletal: Negative.  Negative for falls, joint pain and myalgias.  Skin: Negative for itching and rash.  Neurological: Negative.  Negative  for dizziness, tingling, sensory change, weakness and headaches.  Endo/Heme/Allergies: Negative.  Negative for polydipsia.  Psychiatric/Behavioral: Negative.  Negative for depression, memory loss, substance abuse and suicidal ideas. The patient is not nervous/anxious and does not have insomnia.   All other systems reviewed and are negative.   Physical Exam: Estimated body mass index is 36.73 kg/m as calculated from the following:   Height as of this encounter: 5' 3.25" (1.607 m).   Weight as of this encounter: 209 lb (94.8 kg). BP 120/80   Pulse 70   Temp (!) 97.4 F (36.3 C)   Ht 5' 3.25" (1.607 m)   Wt 209 lb (94.8 kg)   SpO2 97%   BMI 36.73 kg/m  General Appearance: Well nourished, in no apparent distress.   Eyes: PERRLA, EOMs, conjunctiva no swelling or erythema, normal fundi and vessels.  Sinuses: No Frontal/maxillary tenderness  ENT/Mouth: Ext aud canals clear, normal light reflex with TMs without erythema, bulging. Good dentition. No erythema, swelling, or exudate on post pharynx. Tonsils not swollen or erythematous. Hearing normal.  Neck: Supple, thyroid normal. No bruits  Respiratory: Respiratory effort normal, BS equal bilaterally without rales, rhonchi, wheezing or stridor.  Cardio: RRR without murmurs, rubs or gallops. Brisk peripheral pulses without edema.  Chest: symmetric, with normal excursions and percussion.  Breasts: defer GYN Abdomen: Soft, nontender, no guarding, rebound, hernias, masses, or organomegaly.  Lymphatics: Non tender without lymphadenopathy.  Genitourinary: defer Musculoskeletal: Full ROM all peripheral extremities,5/5 strength, and normal gait.  Skin: Warm, dry without rashes, lesions, ecchymosis. Neuro: Cranial nerves intact, reflexes equal bilaterally. Normal muscle tone, no cerebellar symptoms. Sensation intact.  Psych: Awake and oriented X 3, normal affect, Insight and Judgment appropriate.   EKG: WNL AORTA SCAN: defer  Izora Ribas 2:55 PM [facility]

## 2017-09-30 ENCOUNTER — Ambulatory Visit (INDEPENDENT_AMBULATORY_CARE_PROVIDER_SITE_OTHER): Payer: BLUE CROSS/BLUE SHIELD | Admitting: Adult Health

## 2017-09-30 ENCOUNTER — Encounter: Payer: Self-pay | Admitting: Adult Health

## 2017-09-30 VITALS — BP 120/80 | HR 70 | Temp 97.4°F | Ht 63.25 in | Wt 209.0 lb

## 2017-09-30 DIAGNOSIS — I10 Essential (primary) hypertension: Secondary | ICD-10-CM

## 2017-09-30 DIAGNOSIS — Z136 Encounter for screening for cardiovascular disorders: Secondary | ICD-10-CM | POA: Diagnosis not present

## 2017-09-30 DIAGNOSIS — G4733 Obstructive sleep apnea (adult) (pediatric): Secondary | ICD-10-CM

## 2017-09-30 DIAGNOSIS — Z1389 Encounter for screening for other disorder: Secondary | ICD-10-CM

## 2017-09-30 DIAGNOSIS — Z86010 Personal history of colonic polyps: Secondary | ICD-10-CM

## 2017-09-30 DIAGNOSIS — Z1329 Encounter for screening for other suspected endocrine disorder: Secondary | ICD-10-CM | POA: Diagnosis not present

## 2017-09-30 DIAGNOSIS — Z79899 Other long term (current) drug therapy: Secondary | ICD-10-CM

## 2017-09-30 DIAGNOSIS — Z13220 Encounter for screening for lipoid disorders: Secondary | ICD-10-CM

## 2017-09-30 DIAGNOSIS — Z0000 Encounter for general adult medical examination without abnormal findings: Secondary | ICD-10-CM

## 2017-09-30 DIAGNOSIS — Z131 Encounter for screening for diabetes mellitus: Secondary | ICD-10-CM | POA: Diagnosis not present

## 2017-09-30 DIAGNOSIS — K219 Gastro-esophageal reflux disease without esophagitis: Secondary | ICD-10-CM

## 2017-09-30 DIAGNOSIS — Z130 Encounter for screening for diseases of the blood and blood-forming organs and certain disorders involving the immune mechanism: Secondary | ICD-10-CM | POA: Diagnosis not present

## 2017-09-30 DIAGNOSIS — D649 Anemia, unspecified: Secondary | ICD-10-CM

## 2017-09-30 DIAGNOSIS — E785 Hyperlipidemia, unspecified: Secondary | ICD-10-CM

## 2017-09-30 DIAGNOSIS — E559 Vitamin D deficiency, unspecified: Secondary | ICD-10-CM | POA: Diagnosis not present

## 2017-09-30 NOTE — Patient Instructions (Signed)
Aim for 7+ servings of fruits and vegetables daily  80+ fluid ounces of water or unsweet tea for healthy kidneys  Limit alcohol intake  Limit animal fats in diet for cholesterol and heart health - choose grass fed whenever available  Aim for low stress - take time to unwind and care for your mental health  Aim for 150 min of moderate intensity exercise weekly for heart health, and weights twice weekly for bone health  Aim for 7-9 hours of sleep daily      When it comes to diets, agreement about the perfect plan isn't easy to find, even among the experts. Experts at the [employer] developed an idea known as the Healthy Eating Plate. Just imagine a plate divided into logical, healthy portions.  The emphasis is on diet quality:  Load up on vegetables and fruits - one-half of your plate: Aim for color and variety, and remember that potatoes don't count.  Go for whole grains - one-quarter of your plate: Whole wheat, barley, wheat berries, quinoa, oats, brown rice, and foods made with them. If you want pasta, go with whole wheat pasta.  Protein power - one-quarter of your plate: Fish, chicken, beans, and nuts are all healthy, versatile protein sources. Limit red meat.  The diet, however, does go beyond the plate, offering a few other suggestions.  Use healthy plant oils, such as olive, canola, soy, corn, sunflower and peanut. Check the labels, and avoid partially hydrogenated oil, which have unhealthy trans fats.  If you're thirsty, drink water. Coffee and tea are good in moderation, but skip sugary drinks and limit milk and dairy products to one or two daily servings.  The type of carbohydrate in the diet is more important than the amount. Some sources of carbohydrates, such as vegetables, fruits, whole grains, and beans-are healthier than others.  Finally, stay active.  

## 2017-10-01 LAB — CBC WITH DIFFERENTIAL/PLATELET
Basophils Absolute: 47 cells/uL (ref 0–200)
Basophils Relative: 0.8 %
EOS ABS: 41 {cells}/uL (ref 15–500)
Eosinophils Relative: 0.7 %
HEMATOCRIT: 40.6 % (ref 35.0–45.0)
HEMOGLOBIN: 13.8 g/dL (ref 11.7–15.5)
LYMPHS ABS: 2301 {cells}/uL (ref 850–3900)
MCH: 29.7 pg (ref 27.0–33.0)
MCHC: 34 g/dL (ref 32.0–36.0)
MCV: 87.5 fL (ref 80.0–100.0)
MPV: 10.4 fL (ref 7.5–12.5)
Monocytes Relative: 8.5 %
Neutro Abs: 3009 cells/uL (ref 1500–7800)
Neutrophils Relative %: 51 %
Platelets: 256 10*3/uL (ref 140–400)
RBC: 4.64 10*6/uL (ref 3.80–5.10)
RDW: 12.6 % (ref 11.0–15.0)
Total Lymphocyte: 39 %
WBC: 5.9 10*3/uL (ref 3.8–10.8)
WBCMIX: 502 {cells}/uL (ref 200–950)

## 2017-10-01 LAB — COMPLETE METABOLIC PANEL WITH GFR
AG RATIO: 1.6 (calc) (ref 1.0–2.5)
ALKALINE PHOSPHATASE (APISO): 58 U/L (ref 33–130)
ALT: 14 U/L (ref 6–29)
AST: 17 U/L (ref 10–35)
Albumin: 4.5 g/dL (ref 3.6–5.1)
BUN: 13 mg/dL (ref 7–25)
CALCIUM: 9.4 mg/dL (ref 8.6–10.4)
CO2: 25 mmol/L (ref 20–32)
Chloride: 106 mmol/L (ref 98–110)
Creat: 0.91 mg/dL (ref 0.50–1.05)
GFR, EST NON AFRICAN AMERICAN: 70 mL/min/{1.73_m2} (ref >=60)
GFR, Est African American: 81 mL/min/{1.73_m2} (ref >=60)
GLOBULIN: 2.9 g/dL (ref 1.9–3.7)
Glucose, Bld: 95 mg/dL (ref 65–99)
POTASSIUM: 4.2 mmol/L (ref 3.5–5.3)
SODIUM: 139 mmol/L (ref 135–146)
Total Bilirubin: 0.3 mg/dL (ref 0.2–1.2)
Total Protein: 7.4 g/dL (ref 6.1–8.1)

## 2017-10-01 LAB — URINALYSIS W MICROSCOPIC + REFLEX CULTURE
Bacteria, UA: NONE SEEN /HPF
Bilirubin Urine: NEGATIVE
Glucose, UA: NEGATIVE
HYALINE CAST: NONE SEEN /LPF
Hgb urine dipstick: NEGATIVE
Ketones, ur: NEGATIVE
Leukocyte Esterase: NEGATIVE
NITRITES URINE, INITIAL: NEGATIVE
PROTEIN: NEGATIVE
RBC / HPF: NONE SEEN /HPF (ref 0–2)
SPECIFIC GRAVITY, URINE: 1.008 (ref 1.001–1.03)
SQUAMOUS EPITHELIAL / LPF: NONE SEEN /HPF (ref <=5)
WBC, UA: NONE SEEN /HPF (ref 0–5)
pH: 6.5 (ref 5.0–8.0)

## 2017-10-01 LAB — VITAMIN D 25 HYDROXY (VIT D DEFICIENCY, FRACTURES): Vit D, 25-Hydroxy: 46 ng/mL (ref 30–100)

## 2017-10-01 LAB — LIPID PANEL
CHOL/HDL RATIO: 3 (calc) (ref <5.0)
CHOLESTEROL: 165 mg/dL (ref <200)
HDL: 55 mg/dL (ref >50)
LDL Cholesterol (Calc): 91 mg/dL (calc)
Non-HDL Cholesterol (Calc): 110 mg/dL (calc) (ref <130)
Triglycerides: 99 mg/dL (ref <150)

## 2017-10-01 LAB — MAGNESIUM: Magnesium: 2.2 mg/dL (ref 1.5–2.5)

## 2017-10-01 LAB — MICROALBUMIN / CREATININE URINE RATIO: CREATININE, URINE: 27 mg/dL (ref 20–275)

## 2017-10-01 LAB — HEMOGLOBIN A1C
HEMOGLOBIN A1C: 6.4 %{Hb} — AB (ref <5.7)
MEAN PLASMA GLUCOSE: 137 (calc)
eAG (mmol/L): 7.6 (calc)

## 2017-10-01 LAB — NO CULTURE INDICATED

## 2017-10-01 LAB — TSH: TSH: 0.63 mIU/L (ref 0.40–4.50)

## 2017-10-01 LAB — VITAMIN B12: Vitamin B-12: 1852 pg/mL — ABNORMAL HIGH (ref 200–1100)

## 2017-10-31 DIAGNOSIS — H16299 Other keratoconjunctivitis, unspecified eye: Secondary | ICD-10-CM | POA: Diagnosis not present

## 2017-11-17 DIAGNOSIS — Z130 Encounter for screening for diseases of the blood and blood-forming organs and certain disorders involving the immune mechanism: Secondary | ICD-10-CM | POA: Diagnosis not present

## 2017-11-17 DIAGNOSIS — Z1389 Encounter for screening for other disorder: Secondary | ICD-10-CM | POA: Diagnosis not present

## 2017-11-17 DIAGNOSIS — Z01419 Encounter for gynecological examination (general) (routine) without abnormal findings: Secondary | ICD-10-CM | POA: Diagnosis not present

## 2017-11-17 DIAGNOSIS — Z6834 Body mass index (BMI) 34.0-34.9, adult: Secondary | ICD-10-CM | POA: Diagnosis not present

## 2017-11-28 ENCOUNTER — Telehealth: Payer: Self-pay | Admitting: Internal Medicine

## 2017-12-01 NOTE — Telephone Encounter (Signed)
error 

## 2017-12-29 ENCOUNTER — Encounter: Payer: Self-pay | Admitting: Internal Medicine

## 2018-01-05 DIAGNOSIS — R7309 Other abnormal glucose: Secondary | ICD-10-CM | POA: Insufficient documentation

## 2018-01-05 DIAGNOSIS — E559 Vitamin D deficiency, unspecified: Secondary | ICD-10-CM | POA: Insufficient documentation

## 2018-01-05 NOTE — Progress Notes (Signed)
FOLLOW UP  Assessment and Plan:   Hypertension Well controlled off of medications at this time Monitor blood pressure at home; patient to call if consistently greater than 130/80 Continue DASH diet.   Reminder to go to the ER if any CP, SOB, nausea, dizziness, severe HA, changes vision/speech, left arm numbness and tingling and jaw pain.  Cholesterol Currently at goal by lifestyle modification Continue low cholesterol diet and exercise.  Check lipid panel.   Prediabetes Continue diet and exercise.  Perform daily foot/skin check, notify office of any concerning changes.  Check A1C  Obesity with co morbidities She is making excellent progress with weight loss- Long discussion about weight loss, diet, and exercise Recommended diet heavy in fruits and veggies and low in animal meats, cheeses, and dairy products, appropriate calorie intake Discussed ideal weight for height, patient is making excellent progress but would prefer not to set a weight goal Patient will work on continuing current regimen; discussed plateau and what to do if this happens Will follow up in 3 months  Vitamin D Def Below goal at last visit; she has not increase dosed continue supplementation to maintain goal of 70-100 Defer Vit D level  Continue diet and meds as discussed. Further disposition pending results of labs. Discussed med's effects and SE's.   Over 30 minutes of exam, counseling, chart review, and critical decision making was performed.   Future Appointments  Date Time Provider [place]  04/02/2018  3:45 PM Liane Comber, NP [facility] None  10/01/2018  3:00 PM Liane Comber, NP [facility] None    ----------------------------------------------------------------------------------------------------------------------  HPI 57 y.o. female  presents for 3 month follow up on hypertension, cholesterol, prediabetes, morbid obesity and vitamin D deficiency.   BMI is Body mass index is  34.09 kg/m., she has been working on diet and exercise. She is watching sugars/carbs, going to the gym and drinking lots of water.  Wt Readings from Last 3 Encounters:  01/06/18 194 lb (88 kg)  09/30/17 209 lb (94.8 kg)  11/07/16 211 lb 3.2 oz (95.8 kg)   Her blood pressure has been controlled at home, today their BP is BP: 122/76 She does workout, goes to gym and does recumbent bike at home.  She denies chest pain, shortness of breath, dizziness.   She is not on cholesterol medication. Her cholesterol is at goal. The cholesterol last visit was:   Lab Results  Component Value Date   CHOL 165 09/30/2017   HDL 55 09/30/2017   LDLCALC 91 09/30/2017   TRIG 99 09/30/2017   CHOLHDL 3.0 09/30/2017    She has been working on diet and exercise for prediabetes, and denies increased appetite, nausea, paresthesia of the feet, polydipsia, polyuria, visual disturbances and vomiting. Last A1C in the office was:  Lab Results  Component Value Date   HGBA1C 6.4 (H) 09/30/2017   Patient is on Vitamin D supplement.   Lab Results  Component Value Date   VD25OH 46 09/30/2017      Current Medications:  Current Outpatient Medications on File Prior to Visit  Medication Sig  . Ascorbic Acid (VITAMIN C) 1000 MG tablet Take 1,000 mg by mouth daily.  Marland Kitchen aspirin 81 MG tablet Take 81 mg by mouth daily.  . Cholecalciferol (VITAMIN D-3) 5000 UNITS TABS Take 5,000 Units by mouth daily.  . clotrimazole-betamethasone (LOTRISONE) cream Apply 1 application topically 2 (two) times daily.  Marland Kitchen glucosamine-chondroitin 500-400 MG tablet Take 1 tablet by mouth daily.  . Magnesium Chloride (MAGNESIUM DR  PO) Take 250 mg by mouth daily.   . Multiple Vitamin (MULTIVITAMIN) tablet Take 1 tablet by mouth daily.  . vitamin E 400 UNIT capsule Take 400 Units by mouth daily.   No current facility-administered medications on file prior to visit.      Allergies:  Allergies  Allergen Reactions  . Levaquin [Levofloxacin In  D5w] Nausea Only  . Maxitrol [Neomycin-Polymyxin-Dexameth]     Distorted patient's vision for over a month  . Phentermine   . Prilosec [Omeprazole] Rash     Medical History:  Past Medical History:  Diagnosis Date  . DDD (degenerative disc disease), lumbar   . GERD (gastroesophageal reflux disease)   . Hemorrhoid   . HSV-1 infection   . Hyperlipidemia   . Hypertension   . OSA (obstructive sleep apnea)   . Vitamin D deficiency    Family history- Reviewed and unchanged Social history- Reviewed and unchanged   Review of Systems:  Review of Systems  Constitutional: Negative for malaise/fatigue and weight loss.  HENT: Negative for hearing loss and tinnitus.   Eyes: Negative for blurred vision and double vision.  Respiratory: Negative for cough, shortness of breath and wheezing.   Cardiovascular: Negative for chest pain, palpitations, orthopnea, claudication and leg swelling.  Gastrointestinal: Negative for abdominal pain, blood in stool, constipation, diarrhea, heartburn, melena, nausea and vomiting.  Genitourinary: Negative.   Musculoskeletal: Negative for joint pain and myalgias.  Skin: Negative for rash.  Neurological: Negative for dizziness, tingling, sensory change, weakness and headaches.  Endo/Heme/Allergies: Negative for polydipsia.  Psychiatric/Behavioral: Negative.   All other systems reviewed and are negative.    Physical Exam: BP 122/76   Pulse 64   Temp (!) 97.2 F (36.2 C)   Ht 5' 3.25" (1.607 m)   Wt 194 lb (88 kg)   SpO2 97%   BMI 34.09 kg/m  Wt Readings from Last 3 Encounters:  01/06/18 194 lb (88 kg)  09/30/17 209 lb (94.8 kg)  11/07/16 211 lb 3.2 oz (95.8 kg)   General Appearance: Well nourished, in no apparent distress. Eyes: PERRLA, EOMs, conjunctiva no swelling or erythema Sinuses: No Frontal/maxillary tenderness ENT/Mouth: Ext aud canals clear, TMs without erythema, bulging. No erythema, swelling, or exudate on post pharynx.  Tonsils not  swollen or erythematous. Hearing normal.  Neck: Supple, thyroid normal.  Respiratory: Respiratory effort normal, BS equal bilaterally without rales, rhonchi, wheezing or stridor.  Cardio: RRR with no MRGs. Brisk peripheral pulses without edema.  Abdomen: Soft, + BS.  Non tender, no guarding, rebound, hernias, masses. Lymphatics: Non tender without lymphadenopathy.  Musculoskeletal: Full ROM, 5/5 strength, Normal gait Skin: Warm, dry without rashes, lesions, ecchymosis.  Neuro: Cranial nerves intact. No cerebellar symptoms.  Psych: Awake and oriented X 3, normal affect, Insight and Judgment appropriate.    Izora Ribas, NP 4:45 PM [facility]

## 2018-01-06 ENCOUNTER — Ambulatory Visit (INDEPENDENT_AMBULATORY_CARE_PROVIDER_SITE_OTHER): Payer: BLUE CROSS/BLUE SHIELD | Admitting: Adult Health

## 2018-01-06 ENCOUNTER — Encounter: Payer: Self-pay | Admitting: Adult Health

## 2018-01-06 VITALS — BP 122/76 | HR 64 | Temp 97.2°F | Ht 63.25 in | Wt 194.0 lb

## 2018-01-06 DIAGNOSIS — R7303 Prediabetes: Secondary | ICD-10-CM | POA: Diagnosis not present

## 2018-01-06 DIAGNOSIS — E785 Hyperlipidemia, unspecified: Secondary | ICD-10-CM

## 2018-01-06 DIAGNOSIS — I10 Essential (primary) hypertension: Secondary | ICD-10-CM | POA: Diagnosis not present

## 2018-01-06 DIAGNOSIS — E559 Vitamin D deficiency, unspecified: Secondary | ICD-10-CM

## 2018-01-06 DIAGNOSIS — Z79899 Other long term (current) drug therapy: Secondary | ICD-10-CM

## 2018-01-06 DIAGNOSIS — E669 Obesity, unspecified: Secondary | ICD-10-CM

## 2018-01-06 NOTE — Patient Instructions (Signed)
Use a dropper or use a cap to put peroxide, olive oil,mineral oil or canola oil in the effected ear- 2-3 times a week. Let it soak for 20-30 min then you can take a shower or use a baby bulb with warm water to wash out the ear wax.  Do not use Qtips     Can try tylenol/ibuprofen/aleve for joint pain - can also try over the counter arthritis creams which can be very helpful for small joint pains. Let me know if this is getting worse and we can get an xray.     Aim for 7+ servings of fruits and vegetables daily  65-80+ fluid ounces of water or unsweet tea for healthy kidneys  Limit to max 1 drink of alcohol per day; avoid smoking/tobacco  Limit animal fats in diet for cholesterol and heart health - choose grass fed whenever available  Avoid highly processed foods, and foods high in saturated/trans fats  Aim for low stress - take time to unwind and care for your mental health  Aim for 150 min of moderate intensity exercise weekly for heart health, and weights twice weekly for bone health  Aim for 7-9 hours of sleep daily

## 2018-01-07 LAB — CBC WITH DIFFERENTIAL/PLATELET
Basophils Absolute: 48 cells/uL (ref 0–200)
Basophils Relative: 0.8 %
Eosinophils Absolute: 42 cells/uL (ref 15–500)
Eosinophils Relative: 0.7 %
HEMATOCRIT: 39.6 % (ref 35.0–45.0)
HEMOGLOBIN: 13.6 g/dL (ref 11.7–15.5)
LYMPHS ABS: 2586 {cells}/uL (ref 850–3900)
MCH: 29.9 pg (ref 27.0–33.0)
MCHC: 34.3 g/dL (ref 32.0–36.0)
MCV: 87 fL (ref 80.0–100.0)
MPV: 10.7 fL (ref 7.5–12.5)
Monocytes Relative: 8.6 %
NEUTROS PCT: 46.8 %
Neutro Abs: 2808 cells/uL (ref 1500–7800)
Platelets: 232 10*3/uL (ref 140–400)
RBC: 4.55 10*6/uL (ref 3.80–5.10)
RDW: 12.6 % (ref 11.0–15.0)
Total Lymphocyte: 43.1 %
WBC mixed population: 516 cells/uL (ref 200–950)
WBC: 6 10*3/uL (ref 3.8–10.8)

## 2018-01-07 LAB — LIPID PANEL
CHOL/HDL RATIO: 2.7 (calc) (ref <5.0)
CHOLESTEROL: 160 mg/dL (ref <200)
HDL: 60 mg/dL (ref >50)
LDL Cholesterol (Calc): 79 mg/dL (calc)
NON-HDL CHOLESTEROL (CALC): 100 mg/dL (ref <130)
Triglycerides: 112 mg/dL (ref <150)

## 2018-01-07 LAB — HEMOGLOBIN A1C
EAG (MMOL/L): 7.4 (calc)
Hgb A1c MFr Bld: 6.3 % of total Hgb — ABNORMAL HIGH (ref <5.7)
MEAN PLASMA GLUCOSE: 134 (calc)

## 2018-01-07 LAB — COMPLETE METABOLIC PANEL WITH GFR
AG RATIO: 1.3 (calc) (ref 1.0–2.5)
ALBUMIN MSPROF: 3.9 g/dL (ref 3.6–5.1)
ALKALINE PHOSPHATASE (APISO): 49 U/L (ref 33–130)
ALT: 13 U/L (ref 6–29)
AST: 17 U/L (ref 10–35)
BUN: 21 mg/dL (ref 7–25)
CO2: 27 mmol/L (ref 20–32)
CREATININE: 0.83 mg/dL (ref 0.50–1.05)
Calcium: 9.6 mg/dL (ref 8.6–10.4)
Chloride: 107 mmol/L (ref 98–110)
GFR, EST NON AFRICAN AMERICAN: 78 mL/min/{1.73_m2} (ref >=60)
GFR, Est African American: 91 mL/min/{1.73_m2} (ref >=60)
GLOBULIN: 3.1 g/dL (ref 1.9–3.7)
Glucose, Bld: 91 mg/dL (ref 65–99)
Potassium: 4.2 mmol/L (ref 3.5–5.3)
SODIUM: 140 mmol/L (ref 135–146)
Total Bilirubin: 0.3 mg/dL (ref 0.2–1.2)
Total Protein: 7 g/dL (ref 6.1–8.1)

## 2018-01-07 LAB — TSH: TSH: 0.95 mIU/L (ref 0.40–4.50)

## 2018-04-02 ENCOUNTER — Ambulatory Visit: Payer: Self-pay | Admitting: Adult Health

## 2018-06-03 ENCOUNTER — Ambulatory Visit: Payer: BLUE CROSS/BLUE SHIELD | Admitting: Adult Health

## 2018-06-03 ENCOUNTER — Encounter: Payer: Self-pay | Admitting: Adult Health

## 2018-06-03 ENCOUNTER — Other Ambulatory Visit: Payer: Self-pay

## 2018-06-03 VITALS — BP 112/76 | HR 62 | Temp 97.0°F | Ht 63.25 in | Wt 191.6 lb

## 2018-06-03 DIAGNOSIS — H6121 Impacted cerumen, right ear: Secondary | ICD-10-CM

## 2018-06-03 DIAGNOSIS — Z79899 Other long term (current) drug therapy: Secondary | ICD-10-CM

## 2018-06-03 DIAGNOSIS — I10 Essential (primary) hypertension: Secondary | ICD-10-CM | POA: Diagnosis not present

## 2018-06-03 DIAGNOSIS — E785 Hyperlipidemia, unspecified: Secondary | ICD-10-CM

## 2018-06-03 DIAGNOSIS — E669 Obesity, unspecified: Secondary | ICD-10-CM | POA: Diagnosis not present

## 2018-06-03 DIAGNOSIS — E559 Vitamin D deficiency, unspecified: Secondary | ICD-10-CM

## 2018-06-03 DIAGNOSIS — R7309 Other abnormal glucose: Secondary | ICD-10-CM

## 2018-06-03 NOTE — Patient Instructions (Addendum)
  Goals    . DIET - EAT MORE FRUITS AND VEGETABLES     Aim for 7+ servings (1/2 cup each) of fruits and vegetables daily     . Exercise 150 min/wk Moderate Activity      For ear wax Use a dropper or use a cap to put peroxide, olive oil,mineral oil or canola oil in the effected ear- 2-3 times a week. Let it soak for 20-30 min then you can take a shower or use a baby bulb with warm water to wash out the ear wax.  Do not use Qtips       Bad carbs also include fruit juice, alcohol, and sweet tea. These are empty calories that do not signal to your brain that you are full.   Please remember the good carbs are still carbs which convert into sugar. So please measure them out no more than 1/2-1 cup of rice, oatmeal, pasta, and beans  Veggies are however free foods! Pile them on.   Not all fruit is created equal. Please see the list below, the fruit at the bottom is higher in sugars than the fruit at the top. Please avoid all dried fruits.

## 2018-06-03 NOTE — Progress Notes (Signed)
FOLLOW UP  Assessment and Plan:   Hypertension Well controlled off of medications at this time Monitor blood pressure at home; patient to call if consistently greater than 130/80 Continue DASH diet.   Reminder to go to the ER if any CP, SOB, nausea, dizziness, severe HA, changes vision/speech, left arm numbness and tingling and jaw pain.  Cholesterol Currently at goal by lifestyle modification Continue low cholesterol diet and exercise.  Check lipid panel at [facility]  Prediabetes Continue diet and exercise.  Perform daily foot/skin check, notify office of any concerning changes.  Check A1C  Obesity with co morbidities She is making excellent progress with weight loss- Long discussion about weight loss, diet, and exercise Recommended diet heavy in fruits and veggies and low in animal meats, cheeses, and dairy products, appropriate calorie intake Discussed ideal weight for height, patient is making excellent progress but would prefer not to set a weight goal Patient will work on continuing current regimen; discussed plateau and what to do if this happens Will follow up in 3 months  Vitamin D Def Below goal at last visit; she has increased continue supplementation to maintain goal of 60-100 Defer Vit D level to CPE  R ear cerumen impaction - stop using Qtips, irrigation used in the office without complications, use OTC drops/oil at home to prevent reoccurence  Continue diet and meds as discussed. Further disposition pending results of labs. Discussed med's effects and SE's.   Over 30 minutes of exam, counseling, chart review, and critical decision making was performed.   Future Appointments  Date Time Provider [place]  10/01/2018  3:00 PM Liane Comber, NP [facility] None    ----------------------------------------------------------------------------------------------------------------------  HPI 58 y.o. female  presents for 6 month follow up on hypertension,  cholesterol, prediabetes, morbid obesity and vitamin D deficiency.   BMI is Body mass index is 33.67 kg/m., she has been working on diet and exercise. She is watching sugars/carbs, going to the gym and drinking lots of water. Cutting excess carb, doing carb master yogurt, cutting back on fruit. Grilled/baked chicken, shrimp, [place]. Doing almond milk. Wt Readings from Last 3 Encounters:  06/03/18 191 lb 9.6 oz (86.9 kg)  01/06/18 194 lb (88 kg)  09/30/17 209 lb (94.8 kg)   Her blood pressure has been controlled at home, today their BP is BP: 112/76 She does workout, goes to gym and does recumbent bike at home.  She denies chest pain, shortness of breath, dizziness.    She is not on cholesterol medication. Her cholesterol is at goal by lifestyle modification. The cholesterol last visit was:   Lab Results  Component Value Date   CHOL 160 01/06/2018   HDL 60 01/06/2018   LDLCALC 79 01/06/2018   TRIG 112 01/06/2018   CHOLHDL 2.7 01/06/2018    She has been working on diet and exercise for prediabetes, and denies increased appetite, nausea, paresthesia of the feet, polydipsia, polyuria, visual disturbances and vomiting. Last A1C in the office was:  Lab Results  Component Value Date   HGBA1C 6.3 (H) 01/06/2018   Patient is on Vitamin D supplement but remained below goal of 60-100 at last check. She is taking supplement:    Lab Results  Component Value Date   VD25OH 46 09/30/2017       Current Medications:  Current Outpatient Medications on File Prior to Visit  Medication Sig  . Ascorbic Acid (VITAMIN C) 1000 MG tablet Take 1,000 mg by mouth daily.  Marland Kitchen aspirin 81 MG tablet  Take 81 mg by mouth daily.  . Cholecalciferol (VITAMIN D-3) 5000 UNITS TABS Take 5,000 Units by mouth daily.  . clotrimazole-betamethasone (LOTRISONE) cream Apply 1 application topically 2 (two) times daily.  Marland Kitchen glucosamine-chondroitin 500-400 MG tablet Take 1 tablet by mouth daily.  Marland Kitchen Lifitegrast (XIIDRA) 5 % SOLN  Apply to eye 2 (two) times daily.  . Magnesium Chloride (MAGNESIUM DR PO) Take 250 mg by mouth daily.   . Multiple Vitamin (MULTIVITAMIN) tablet Take 1 tablet by mouth daily.  . vitamin E 400 UNIT capsule Take 400 Units by mouth daily.   No current facility-administered medications on file prior to visit.      Allergies:  Allergies  Allergen Reactions  . Levaquin [Levofloxacin In D5w] Nausea Only  . Maxitrol [Neomycin-Polymyxin-Dexameth]     Distorted patient's vision for over a month  . Phentermine   . Prilosec [Omeprazole] Rash     Medical History:  Past Medical History:  Diagnosis Date  . DDD (degenerative disc disease), lumbar   . GERD (gastroesophageal reflux disease)   . Hemorrhoid   . HSV-1 infection   . Hyperlipidemia   . Hypertension   . OSA (obstructive sleep apnea)   . Vitamin D deficiency    Family history- Reviewed and unchanged Social history- Reviewed and unchanged   Review of Systems:  Review of Systems  Constitutional: Negative for malaise/fatigue and weight loss.  HENT: Negative for hearing loss and tinnitus.   Eyes: Negative for blurred vision and double vision.  Respiratory: Negative for cough, shortness of breath and wheezing.   Cardiovascular: Negative for chest pain, palpitations, orthopnea, claudication and leg swelling.  Gastrointestinal: Negative for abdominal pain, blood in stool, constipation, diarrhea, heartburn, melena, nausea and vomiting.  Genitourinary: Negative.   Musculoskeletal: Negative for joint pain and myalgias.  Skin: Negative for rash.  Neurological: Negative for dizziness, tingling, sensory change, weakness and headaches.  Endo/Heme/Allergies: Negative for polydipsia.  Psychiatric/Behavioral: Negative.   All other systems reviewed and are negative.    Physical Exam: BP 112/76   Pulse 62   Temp (!) 97 F (36.1 C)   Ht 5' 3.25" (1.607 m)   Wt 191 lb 9.6 oz (86.9 kg)   SpO2 99%   BMI 33.67 kg/m  Wt Readings from  Last 3 Encounters:  06/03/18 191 lb 9.6 oz (86.9 kg)  01/06/18 194 lb (88 kg)  09/30/17 209 lb (94.8 kg)   General Appearance: Well nourished, in no apparent distress. Eyes: PERRLA, EOMs, conjunctiva no swelling or erythema Sinuses: No Frontal/maxillary tenderness ENT/Mouth: L Ext aud canals clear, R obstructed by firm wax; L TM without erythema, bulging. No erythema, swelling, or exudate on post pharynx.  Tonsils not swollen or erythematous. Hearing normal.  Neck: Supple, thyroid normal.  Respiratory: Respiratory effort normal, BS equal bilaterally without rales, rhonchi, wheezing or stridor.  Cardio: RRR with no MRGs. Brisk peripheral pulses without edema.  Abdomen: Soft, + BS.  Non tender, no guarding, rebound, hernias, masses. Lymphatics: Non tender without lymphadenopathy.  Musculoskeletal: Full ROM, 5/5 strength, Normal gait Skin: Warm, dry without rashes, lesions, ecchymosis.  Neuro: Cranial nerves intact. No cerebellar symptoms.  Psych: Awake and oriented X 3, normal affect, Insight and Judgment appropriate.    Izora Ribas, NP 4:12 PM [facility]

## 2018-06-04 LAB — HEMOGLOBIN A1C
EAG (MMOL/L): 6.8 (calc)
Hgb A1c MFr Bld: 5.9 % of total Hgb — ABNORMAL HIGH (ref <5.7)
Mean Plasma Glucose: 123 (calc)

## 2018-08-05 ENCOUNTER — Other Ambulatory Visit: Payer: Self-pay | Admitting: Internal Medicine

## 2018-08-05 DIAGNOSIS — Z1231 Encounter for screening mammogram for malignant neoplasm of breast: Secondary | ICD-10-CM

## 2018-08-12 ENCOUNTER — Encounter: Payer: Self-pay | Admitting: Internal Medicine

## 2018-09-05 ENCOUNTER — Other Ambulatory Visit: Payer: Self-pay

## 2018-09-05 ENCOUNTER — Ambulatory Visit
Admission: RE | Admit: 2018-09-05 | Discharge: 2018-09-05 | Disposition: A | Discharge status: Home / Self Care | Payer: BLUE CROSS/BLUE SHIELD | Source: Ambulatory Visit | Attending: Internal Medicine | Admitting: Internal Medicine

## 2018-09-05 DIAGNOSIS — Z1231 Encounter for screening mammogram for malignant neoplasm of breast: Secondary | ICD-10-CM

## 2018-09-09 ENCOUNTER — Encounter: Payer: BLUE CROSS/BLUE SHIELD | Admitting: Internal Medicine

## 2018-09-30 NOTE — Progress Notes (Signed)
Complete Physical  Assessment and Plan:  Encounter for general adult medical examination with abnormal findings  Essential hypertension - continue medications, DASH diet, exercise and monitor at home. Call if greater than 130/80.  -     CBC with Differential/Platelet -     CMP/GFR -     TSH -     Urinalysis, Routine w reflex microscopic -     Microalbumin / creatinine urine ratio -     EKG  Hyperlipidemia, unspecified hyperlipidemia type -continue medications, check lipids, decrease fatty foods, increase activity.  -     Lipid panel  PreDM Discussed general issues about diabetes pathophysiology and management., Educational material distributed., Suggested low cholesterol diet., Encouraged aerobic exercise., Discussed foot care., Reminded to get yearly retinal exam. Defer A1C has already had this year and high deductible insurance Monitor weight, serum glucose  Obesity -BMI 33 Long discussion about weight loss, diet, and exercise Recommended diet heavy in fruits and veggies and low in animal meats, cheeses, and dairy products, appropriate calorie intake Discussed appropriate weight for height  Continue excellent progress with exercise, diet/portions Follow up at next visit  Vitamin D deficiency Continue supplement for goal of 60-100 -     VITAMIN D 25 Hydroxy (Vit-D Deficiency, Fractures)  Medication management -     Magnesium  History of adenomatous polyp of colon/blood on toilet paper Insurance will not pay prior to 10 years, she cannot afford to self pay Last report reviewed; no precancerous changes on pathology, single polyp DRE normal, guaiac negative today, bleeding possible mild internal hemorroid/fissure, information provided, can try OTC prep H or call if develops discomfort, bowel management reviewed  Discussed med's effects and SE's. Screening labs and tests as requested with regular follow-up as recommended. Over 40 minutes of exam, counseling, chart review, and  complex, high level critical decision making was performed this visit.   Future Appointments  Date Time Provider [place]  10/06/2019  3:00 PM Heather Comber, NP [facility] None     HPI  58 y.o. female  presents for a complete physical and follow up for has Hyperlipidemia; Hypertension; GERD (gastroesophageal reflux disease); OSA (obstructive sleep apnea); Obesity (BMI 30.0-34.9); History of adenomatous polyp of colon; Other abnormal glucose (prediabetes); and Vitamin D deficiency on their problem list.   She works as Therapist, art on phone. Single, no children. She sees GYN annually.  Her good friend passed away in 07/22/2022 this year, sad since not able to socialize and grieve as she would have liked, but improving.   She reports occasional streaks of blood on toilet paper on days she has walked extensively and has a BM. Resolves quickly and spontaneously, denies pain, constipation. Last colonoscopy in 2014 showed no hemorrhoids. Single benign polyp.   BMI is Body mass index is 33.39 kg/m., she has been working on diet and exercise. has been working on diet and exercise. She is watching sugars/carbs, walking 1 hour 4 days a week since not able to go to the gym, and drinking lots of water. Cutting excess carb, doing carb master yogurt, cutting back on fruit. Grilled/baked chicken, shrimp, [place]. Doing almond milk. Wt Readings from Last 3 Encounters:  10/01/18 190 lb (86.2 kg)  06/03/18 191 lb 9.6 oz (86.9 kg)  01/06/18 194 lb (88 kg)   Her blood pressure has been controlled at home, today their BP is BP: 120/82 She does workout, walking a lot. She denies chest pain, shortness of breath, dizziness.   She is not  on cholesterol medication and denies myalgias. Her cholesterol is at goal. The cholesterol last visit was:   Lab Results  Component Value Date   CHOL 160 01/06/2018   HDL 60 01/06/2018   LDLCALC 79 01/06/2018   TRIG 112 01/06/2018   CHOLHDL 2.7 01/06/2018   She has  been working on diet and exercise for prediabetes,  and denies paresthesia of the feet, polydipsia, polyuria and visual disturbances. Last A1C in the office was:  Lab Results  Component Value Date   HGBA1C 5.9 (H) 06/03/2018   Lab Results  Component Value Date   GFRAA 91 01/06/2018   Patient is on Vitamin D supplement, 5000 IU  Lab Results  Component Value Date   VD25OH 46 09/30/2017      Current Medications:  Current Outpatient Medications on File Prior to Visit  Medication Sig Dispense Refill  . Ascorbic Acid (VITAMIN C) 1000 MG tablet Take 1,000 mg by mouth daily.    Marland Kitchen aspirin 81 MG tablet Take 81 mg by mouth daily.    . Cholecalciferol (VITAMIN D-3) 5000 UNITS TABS Take 5,000 Units by mouth daily.    . clotrimazole-betamethasone (LOTRISONE) cream Apply 1 application topically 2 (two) times daily. (Patient taking differently: Apply 1 application topically as needed. ) 15 g 2  . glucosamine-chondroitin 500-400 MG tablet Take 1 tablet by mouth daily.    Marland Kitchen Lifitegrast (XIIDRA) 5 % SOLN Apply to eye 2 (two) times daily.    . Magnesium Chloride (MAGNESIUM DR PO) Take 250 mg by mouth daily.     . Multiple Vitamin (MULTIVITAMIN) tablet Take 1 tablet by mouth daily.    . vitamin E 400 UNIT capsule Take 400 Units by mouth daily.     No current facility-administered medications on file prior to visit.    Allergies:  Allergies  Allergen Reactions  . Levaquin [Levofloxacin In D5w] Nausea Only  . Maxitrol [Neomycin-Polymyxin-Dexameth]     Distorted patient's vision for over a month  . Phentermine   . Prilosec [Omeprazole] Rash   Medical History:  She has Hyperlipidemia; Hypertension; GERD (gastroesophageal reflux disease); OSA (obstructive sleep apnea); Obesity (BMI 30.0-34.9); History of adenomatous polyp of colon; Other abnormal glucose (prediabetes); and Vitamin D deficiency on their problem list.   Health Maintenance:   Immunization History  Administered Date(s) Administered  .  Influenza-Unspecified 12/07/2013, 01/06/2015, 11/25/2015, 12/08/2017  . PPD Test 08/19/2013  . Tdap 01/12/2010   Tetanus: 2011 Pneumonia: N/A Prevnar due age 89 Shingles vaccine: -  Flu vaccine: 2019  Pap: 2019,  Has annually at [facility] MGM: 08/2018 Colonoscopy: 2014, due follow up 10/2017 - patient attempted to schedule but was told insurance would not pay until 10 years and cannot afford to pay   Reviewed last - polyp without precancerous changes  Last Eye Exam: [facility], 01/2018, contacts/glasses Last dental exam: Dental works on [employer], 06/2017, goes q78m, overdue  Last derm: 2019  Patient Care Team: Unk Pinto, MD as PCP - General (Internal Medicine) Deneise Lever, MD as Consulting Physician (Pulmonary Disease) Irene Shipper, MD as Consulting Physician (Gastroenterology) Lavonna Monarch, MD as Consulting Physician (Dermatology)  Surgical History:  She has a past surgical history that includes Diagnostic laparoscopy (1998). Family History:  Herfamily history includes Dementia (age of onset: 3) in her mother; Diabetes in her maternal grandfather, maternal grandmother, paternal grandfather, and paternal grandmother; Hypertension in her mother; Lung cancer (age of onset: 50) in her father. Social History:  She reports that  she has never smoked. She has never used smokeless tobacco. She reports that she does not drink alcohol or use drugs.  Review of Systems: Review of Systems  Constitutional: Negative.  Negative for malaise/fatigue and weight loss.  HENT: Negative.  Negative for hearing loss and tinnitus.   Eyes: Negative.  Negative for blurred vision and double vision.  Respiratory: Negative.  Negative for cough, sputum production, shortness of breath and wheezing.   Cardiovascular: Negative.  Negative for chest pain, palpitations, orthopnea, claudication, leg swelling and PND.  Gastrointestinal: Negative.  Negative for abdominal pain, blood in stool,  constipation, diarrhea, heartburn, melena, nausea and vomiting.  Genitourinary: Negative.   Musculoskeletal: Negative.  Negative for falls, joint pain and myalgias.  Skin: Negative for itching and rash.  Neurological: Negative.  Negative for dizziness, tingling, sensory change, weakness and headaches.  Endo/Heme/Allergies: Negative.  Negative for polydipsia.  Psychiatric/Behavioral: Negative.  Negative for depression, memory loss, substance abuse and suicidal ideas. The patient is not nervous/anxious and does not have insomnia.   All other systems reviewed and are negative.   Physical Exam: Estimated body mass index is 33.39 kg/m as calculated from the following:   Height as of this encounter: 5' 3.25" (1.607 m).   Weight as of this encounter: 190 lb (86.2 kg). BP 120/82   Pulse 67   Temp (!) 97.3 F (36.3 C)   Ht 5' 3.25" (1.607 m)   Wt 190 lb (86.2 kg)   SpO2 98%   BMI 33.39 kg/m  General Appearance: Well nourished, in no apparent distress.  Eyes: PERRLA, EOMs, conjunctiva no swelling or erythema Sinuses: No Frontal/maxillary tenderness  ENT/Mouth: Ext aud canals clear, normal light reflex with TMs without erythema, bulging. Good dentition. No erythema, swelling, or exudate on post pharynx. Tonsils not swollen or erythematous. Hearing normal.  Neck: Supple, thyroid normal. No bruits  Respiratory: Respiratory effort normal, BS equal bilaterally without rales, rhonchi, wheezing or stridor.  Cardio: RRR without murmurs, rubs or gallops. Brisk peripheral pulses without edema.  Chest: symmetric, with normal excursions and percussion.  Breasts: defer GYN Abdomen: Soft, nontender, no guarding, rebound, hernias, masses, or organomegaly.  Lymphatics: Non tender without lymphadenopathy.  Genitourinary: defer to GYN Musculoskeletal: Full ROM all peripheral extremities,5/5 strength, and normal gait.  Skin: Warm, dry without rashes, lesions, ecchymosis. Neuro: Cranial nerves intact,  reflexes equal bilaterally. Normal muscle tone, no cerebellar symptoms. Sensation intact.  Psych: Awake and oriented X 3, normal affect, Insight and Judgment appropriate.  Rectal exam: negative without mass, lesions or tenderness, sphincter tone normal, stool guaiac negative.   EKG: Sinus brady, WNL  Heather Walton 3:27 PM [facility]

## 2018-10-01 ENCOUNTER — Other Ambulatory Visit: Payer: Self-pay

## 2018-10-01 ENCOUNTER — Encounter: Payer: Self-pay | Admitting: Adult Health

## 2018-10-01 ENCOUNTER — Ambulatory Visit: Payer: BC Managed Care – PPO | Admitting: Adult Health

## 2018-10-01 VITALS — BP 120/82 | HR 67 | Temp 97.3°F | Ht 63.25 in | Wt 190.0 lb

## 2018-10-01 DIAGNOSIS — R7309 Other abnormal glucose: Secondary | ICD-10-CM

## 2018-10-01 DIAGNOSIS — Z79899 Other long term (current) drug therapy: Secondary | ICD-10-CM | POA: Diagnosis not present

## 2018-10-01 DIAGNOSIS — E559 Vitamin D deficiency, unspecified: Secondary | ICD-10-CM

## 2018-10-01 DIAGNOSIS — E669 Obesity, unspecified: Secondary | ICD-10-CM

## 2018-10-01 DIAGNOSIS — E785 Hyperlipidemia, unspecified: Secondary | ICD-10-CM

## 2018-10-01 DIAGNOSIS — G4733 Obstructive sleep apnea (adult) (pediatric): Secondary | ICD-10-CM

## 2018-10-01 DIAGNOSIS — Z13220 Encounter for screening for lipoid disorders: Secondary | ICD-10-CM | POA: Diagnosis not present

## 2018-10-01 DIAGNOSIS — Z1329 Encounter for screening for other suspected endocrine disorder: Secondary | ICD-10-CM | POA: Diagnosis not present

## 2018-10-01 DIAGNOSIS — Z0000 Encounter for general adult medical examination without abnormal findings: Secondary | ICD-10-CM

## 2018-10-01 DIAGNOSIS — I10 Essential (primary) hypertension: Secondary | ICD-10-CM | POA: Diagnosis not present

## 2018-10-01 DIAGNOSIS — Z8249 Family history of ischemic heart disease and other diseases of the circulatory system: Secondary | ICD-10-CM

## 2018-10-01 DIAGNOSIS — Z131 Encounter for screening for diabetes mellitus: Secondary | ICD-10-CM

## 2018-10-01 DIAGNOSIS — K219 Gastro-esophageal reflux disease without esophagitis: Secondary | ICD-10-CM

## 2018-10-01 DIAGNOSIS — Z86010 Personal history of colonic polyps: Secondary | ICD-10-CM

## 2018-10-01 DIAGNOSIS — Z1389 Encounter for screening for other disorder: Secondary | ICD-10-CM | POA: Diagnosis not present

## 2018-10-01 NOTE — Patient Instructions (Addendum)
Heather Walton , Thank you for taking time to come for your Annual Wellness Visit. I appreciate your ongoing commitment to your health goals. Please review the following plan we discussed and let me know if I can assist you in the future.   These are the goals we discussed: Goals    . DIET - EAT MORE FRUITS AND VEGETABLES     Aim for 7+ servings (1/2 cup each) of fruits and vegetables daily     . Exercise 150 min/wk Moderate Activity       This is a list of the screening recommended for you and due dates:  Health Maintenance  Topic Date Due  .  Hepatitis C: One time screening is recommended by Center for Disease Control  (CDC) for  adults born from [birth year] through 1965.   [birth year]  . HIV Screening  09/12/1975  . Pap Smear  09/11/1981  . Colon Cancer Screening  11/06/2017  . Flu Shot  10/10/2018  . Tetanus Vaccine  01/13/2020  . Mammogram  09/04/2020    Hemorrhoids Hemorrhoids are swollen veins in and around the rectum or anus. There are two types of hemorrhoids:  Internal hemorrhoids. These occur in the veins that are just inside the rectum. They may poke through to the outside and become irritated and painful.  External hemorrhoids. These occur in the veins that are outside the anus and can be felt as a painful swelling or hard lump near the anus. Most hemorrhoids do not cause serious problems, and they can be managed with home treatments such as diet and lifestyle changes. If home treatments do not help the symptoms, procedures can be done to shrink or remove the hemorrhoids. What are the causes? This condition is caused by increased pressure in the anal area. This pressure may result from various things, including:  Constipation.  Straining to have a bowel movement.  Diarrhea.  Pregnancy.  Obesity.  Sitting for long periods of time.  Heavy lifting or other activity that causes you to strain.  Anal sex.  Riding a bike for a long period of time. What are the signs or  symptoms? Symptoms of this condition include:  Pain.  Anal itching or irritation.  Rectal bleeding.  Leakage of stool (feces).  Anal swelling.  One or more lumps around the anus. How is this diagnosed? This condition can often be diagnosed through a visual exam. Other exams or tests may also be done, such as:  An exam that involves feeling the rectal area with a gloved hand (digital rectal exam).  An exam of the anal canal that is done using a small tube (anoscope).  A blood test, if you have lost a significant amount of blood.  A test to look inside the colon using a flexible tube with a camera on the end (sigmoidoscopy or colonoscopy). How is this treated? This condition can usually be treated at home. However, various procedures may be done if dietary changes, lifestyle changes, and other home treatments do not help your symptoms. These procedures can help make the hemorrhoids smaller or remove them completely. Some of these procedures involve surgery, and others do not. Common procedures include:  Rubber band ligation. Rubber bands are placed at the base of the hemorrhoids to cut off their blood supply.  Sclerotherapy. Medicine is injected into the hemorrhoids to shrink them.  Infrared coagulation. A type of light energy is used to get rid of the hemorrhoids.  Hemorrhoidectomy surgery. The hemorrhoids are surgically removed,  and the veins that supply them are tied off.  Stapled hemorrhoidopexy surgery. The surgeon staples the base of the hemorrhoid to the rectal wall. Follow these instructions at home: Eating and drinking   Eat foods that have a lot of fiber in them, such as whole grains, beans, nuts, fruits, and vegetables.  Ask your health care provider about taking products that have added fiber (fiber supplements).  Reduce the amount of fat in your diet. You can do this by eating low-fat dairy products, eating less red meat, and avoiding processed foods.  Drink  enough fluid to keep your urine pale yellow. Managing pain and swelling   Take warm sitz baths for 20 minutes, 3-4 times a day to ease pain and discomfort. You may do this in a bathtub or using a portable sitz bath that fits over the toilet.  If directed, apply ice to the affected area. Using ice packs between sitz baths may be helpful. ? Put ice in a plastic bag. ? Place a towel between your skin and the bag. ? Leave the ice on for 20 minutes, 2-3 times a day. General instructions  Take over-the-counter and prescription medicines only as told by your health care provider.  Use medicated creams or suppositories as told.  Get regular exercise. Ask your health care provider how much and what kind of exercise is best for you. In general, you should do moderate exercise for at least 30 minutes on most days of the week (150 minutes each week). This can include activities such as walking, biking, or yoga.  Go to the bathroom when you have the urge to have a bowel movement. Do not wait.  Avoid straining to have bowel movements.  Keep the anal area dry and clean. Use wet toilet paper or moist towelettes after a bowel movement.  Do not sit on the toilet for long periods of time. This increases blood pooling and pain.  Keep all follow-up visits as told by your health care provider. This is important. Contact a health care provider if you have:  Increasing pain and swelling that are not controlled by treatment or medicine.  Difficulty having a bowel movement, or you are unable to have a bowel movement.  Pain or inflammation outside the area of the hemorrhoids. Get help right away if you have:  Uncontrolled bleeding from your rectum. Summary  Hemorrhoids are swollen veins in and around the rectum or anus.  Most hemorrhoids can be managed with home treatments such as diet and lifestyle changes.  Taking warm sitz baths can help ease pain and discomfort.  In severe cases, procedures or  surgery can be done to shrink or remove the hemorrhoids. This information is not intended to replace advice given to you by your health care provider. Make sure you discuss any questions you have with your health care provider. Document Released: 02/23/2000 Document Revised: 03/05/2018 Document Reviewed: 07/17/2017 Elsevier Patient Education  2020 [place] also include fruit juice, alcohol, and sweet tea. These are empty calories that do not signal to your brain that you are full.   Please remember the good carbs are still carbs which convert into sugar. So please measure them out no more than 1/2-1 cup of rice, oatmeal, pasta, and beans  Veggies are however free foods! Pile them on.   Not all fruit is created equal. Please see the list below, the fruit at the bottom is higher in sugars than the fruit  at the top. Please avoid all dried fruits.

## 2018-10-02 LAB — CBC WITH DIFFERENTIAL/PLATELET
Absolute Monocytes: 446 cells/uL (ref 200–950)
Basophils Absolute: 72 cells/uL (ref 0–200)
Basophils Relative: 1.3 %
Eosinophils Absolute: 39 cells/uL (ref 15–500)
Eosinophils Relative: 0.7 %
HCT: 40.7 % (ref 35.0–45.0)
Hemoglobin: 13.4 g/dL (ref 11.7–15.5)
Lymphs Abs: 2574 cells/uL (ref 850–3900)
MCH: 29.8 pg (ref 27.0–33.0)
MCHC: 32.9 g/dL (ref 32.0–36.0)
MCV: 90.4 fL (ref 80.0–100.0)
MPV: 10.9 fL (ref 7.5–12.5)
Monocytes Relative: 8.1 %
Neutro Abs: 2371 cells/uL (ref 1500–7800)
Neutrophils Relative %: 43.1 %
Platelets: 222 10*3/uL (ref 140–400)
RBC: 4.5 10*6/uL (ref 3.80–5.10)
RDW: 12.7 % (ref 11.0–15.0)
Total Lymphocyte: 46.8 %
WBC: 5.5 10*3/uL (ref 3.8–10.8)

## 2018-10-02 LAB — URINALYSIS, ROUTINE W REFLEX MICROSCOPIC
Bacteria, UA: NONE SEEN /HPF
Bilirubin Urine: NEGATIVE
Glucose, UA: NEGATIVE
Hgb urine dipstick: NEGATIVE
Hyaline Cast: NONE SEEN /LPF
Ketones, ur: NEGATIVE
Nitrite: NEGATIVE
Protein, ur: NEGATIVE
Specific Gravity, Urine: 1.015 (ref 1.001–1.03)
Squamous Epithelial / HPF: NONE SEEN /HPF (ref <=5)
pH: <=5 (ref 5.0–8.0)

## 2018-10-02 LAB — COMPLETE METABOLIC PANEL WITH GFR
AG Ratio: 1.6 (calc) (ref 1.0–2.5)
ALT: 14 U/L (ref 6–29)
AST: 16 U/L (ref 10–35)
Albumin: 4.6 g/dL (ref 3.6–5.1)
Alkaline phosphatase (APISO): 43 U/L (ref 37–153)
BUN: 18 mg/dL (ref 7–25)
CO2: 26 mmol/L (ref 20–32)
Calcium: 10 mg/dL (ref 8.6–10.4)
Chloride: 104 mmol/L (ref 98–110)
Creat: 0.79 mg/dL (ref 0.50–1.05)
GFR, Est African American: 96 mL/min/{1.73_m2} (ref >=60)
GFR, Est Non African American: 83 mL/min/{1.73_m2} (ref >=60)
Globulin: 2.9 g/dL (calc) (ref 1.9–3.7)
Glucose, Bld: 118 mg/dL — ABNORMAL HIGH (ref 65–99)
Potassium: 4.2 mmol/L (ref 3.5–5.3)
Sodium: 140 mmol/L (ref 135–146)
Total Bilirubin: 0.2 mg/dL (ref 0.2–1.2)
Total Protein: 7.5 g/dL (ref 6.1–8.1)

## 2018-10-02 LAB — MAGNESIUM: Magnesium: 2.1 mg/dL (ref 1.5–2.5)

## 2018-10-02 LAB — TSH: TSH: 0.76 mIU/L (ref 0.40–4.50)

## 2018-10-02 LAB — VITAMIN D 25 HYDROXY (VIT D DEFICIENCY, FRACTURES): Vit D, 25-Hydroxy: 76 ng/mL (ref 30–100)

## 2018-10-02 LAB — LIPID PANEL
Cholesterol: 173 mg/dL (ref <200)
HDL: 66 mg/dL (ref >=50)
LDL Cholesterol (Calc): 90 mg/dL (calc)
Non-HDL Cholesterol (Calc): 107 mg/dL (calc) (ref <130)
Total CHOL/HDL Ratio: 2.6 (calc) (ref <5.0)
Triglycerides: 84 mg/dL (ref <150)

## 2018-11-25 ENCOUNTER — Other Ambulatory Visit: Payer: Self-pay | Admitting: Adult Health

## 2018-11-25 MED ORDER — HALOBETASOL PROPIONATE 0.05 % EX OINT: TOPICAL_OINTMENT | Freq: Two times a day (BID) | CUTANEOUS | 0 refills | Status: DC

## 2018-12-01 DIAGNOSIS — Z6833 Body mass index (BMI) 33.0-33.9, adult: Secondary | ICD-10-CM | POA: Diagnosis not present

## 2018-12-01 DIAGNOSIS — Z01419 Encounter for gynecological examination (general) (routine) without abnormal findings: Secondary | ICD-10-CM | POA: Diagnosis not present

## 2018-12-01 DIAGNOSIS — Z130 Encounter for screening for diseases of the blood and blood-forming organs and certain disorders involving the immune mechanism: Secondary | ICD-10-CM | POA: Diagnosis not present

## 2018-12-01 DIAGNOSIS — Z1389 Encounter for screening for other disorder: Secondary | ICD-10-CM | POA: Diagnosis not present

## 2019-04-05 ENCOUNTER — Ambulatory Visit: Payer: BC Managed Care – PPO | Admitting: Adult Health

## 2019-04-07 DIAGNOSIS — R519 Headache, unspecified: Secondary | ICD-10-CM | POA: Diagnosis not present

## 2019-04-07 DIAGNOSIS — Z20822 Contact with and (suspected) exposure to covid-19: Secondary | ICD-10-CM | POA: Diagnosis not present

## 2019-04-07 DIAGNOSIS — J069 Acute upper respiratory infection, unspecified: Secondary | ICD-10-CM | POA: Diagnosis not present

## 2019-04-08 ENCOUNTER — Other Ambulatory Visit: Payer: BC Managed Care – PPO

## 2019-04-08 ENCOUNTER — Ambulatory Visit: Payer: BC Managed Care – PPO

## 2019-04-09 ENCOUNTER — Other Ambulatory Visit: Payer: Self-pay | Admitting: Adult Health

## 2019-04-09 MED ORDER — ALBUTEROL SULFATE HFA 108 (90 BASE) MCG/ACT IN AERS: 1.0000 | INHALATION_SPRAY | RESPIRATORY_TRACT | 0 refills | Status: DC | PRN

## 2019-04-09 MED ORDER — PROMETHAZINE-DM 6.25-15 MG/5ML PO SYRP: 5.0000 mL | ORAL_SOLUTION | Freq: Four times a day (QID) | ORAL | 1 refills | Status: DC | PRN

## 2019-05-06 ENCOUNTER — Ambulatory Visit: Payer: BC Managed Care – PPO | Admitting: Adult Health

## 2019-08-04 ENCOUNTER — Other Ambulatory Visit: Payer: Self-pay | Admitting: Internal Medicine

## 2019-08-04 DIAGNOSIS — Z1231 Encounter for screening mammogram for malignant neoplasm of breast: Secondary | ICD-10-CM

## 2019-09-09 ENCOUNTER — Ambulatory Visit
Admission: RE | Admit: 2019-09-09 | Discharge: 2019-09-09 | Disposition: A | Discharge status: Home / Self Care | Payer: BC Managed Care – PPO | Source: Ambulatory Visit | Attending: Internal Medicine | Admitting: Internal Medicine

## 2019-09-09 ENCOUNTER — Other Ambulatory Visit: Payer: Self-pay

## 2019-09-09 DIAGNOSIS — Z1231 Encounter for screening mammogram for malignant neoplasm of breast: Secondary | ICD-10-CM

## 2019-10-06 ENCOUNTER — Ambulatory Visit: Payer: BC Managed Care – PPO | Admitting: Adult Health

## 2019-10-06 ENCOUNTER — Encounter: Payer: Self-pay | Admitting: Adult Health

## 2019-10-06 ENCOUNTER — Other Ambulatory Visit: Payer: Self-pay

## 2019-10-06 VITALS — BP 124/82 | HR 58 | Temp 97.3°F | Ht 63.25 in | Wt 193.0 lb

## 2019-10-06 DIAGNOSIS — E785 Hyperlipidemia, unspecified: Secondary | ICD-10-CM

## 2019-10-06 DIAGNOSIS — Z23 Encounter for immunization: Secondary | ICD-10-CM

## 2019-10-06 DIAGNOSIS — Z13220 Encounter for screening for lipoid disorders: Secondary | ICD-10-CM | POA: Diagnosis not present

## 2019-10-06 DIAGNOSIS — Z79899 Other long term (current) drug therapy: Secondary | ICD-10-CM

## 2019-10-06 DIAGNOSIS — Z1329 Encounter for screening for other suspected endocrine disorder: Secondary | ICD-10-CM | POA: Diagnosis not present

## 2019-10-06 DIAGNOSIS — Z1389 Encounter for screening for other disorder: Secondary | ICD-10-CM

## 2019-10-06 DIAGNOSIS — Z86010 Personal history of colonic polyps: Secondary | ICD-10-CM

## 2019-10-06 DIAGNOSIS — E669 Obesity, unspecified: Secondary | ICD-10-CM

## 2019-10-06 DIAGNOSIS — Z136 Encounter for screening for cardiovascular disorders: Secondary | ICD-10-CM

## 2019-10-06 DIAGNOSIS — Z0000 Encounter for general adult medical examination without abnormal findings: Secondary | ICD-10-CM

## 2019-10-06 DIAGNOSIS — R7309 Other abnormal glucose: Secondary | ICD-10-CM

## 2019-10-06 DIAGNOSIS — Z131 Encounter for screening for diabetes mellitus: Secondary | ICD-10-CM

## 2019-10-06 DIAGNOSIS — I10 Essential (primary) hypertension: Secondary | ICD-10-CM

## 2019-10-06 DIAGNOSIS — E66811 Obesity, class 1: Secondary | ICD-10-CM

## 2019-10-06 DIAGNOSIS — E559 Vitamin D deficiency, unspecified: Secondary | ICD-10-CM

## 2019-10-06 DIAGNOSIS — Z860101 Personal history of adenomatous and serrated colon polyps: Secondary | ICD-10-CM

## 2019-10-06 NOTE — Progress Notes (Signed)
Complete Physical  Assessment and Plan:  Encounter for general adult medical examination with abnormal findings Td administered today   Essential hypertension - continue medications, DASH diet, exercise and monitor at home. Call if greater than 130/80.  -     CBC with Differential/Platelet -     CMP/GFR -     TSH -     Urinalysis, Routine w reflex microscopic -     Microalbumin / creatinine urine ratio -     EKG  Hyperlipidemia, unspecified hyperlipidemia type -continue medications, check lipids, decrease fatty foods, increase activity.  -     Lipid panel  PreDM Discussed general issues about diabetes pathophysiology and management., Educational material distributed., Suggested low cholesterol diet., Encouraged aerobic exercise., Discussed foot care., Reminded to get yearly retinal exam. Defer A1C has already had this year and high deductible insurance Monitor weight, serum glucose  Obesity -BMI 33 Long discussion about weight loss, diet, and exercise Recommended diet heavy in fruits and veggies and low in animal meats, cheeses, and dairy products, appropriate calorie intake Discussed appropriate weight for height  Continue excellent progress with exercise, diet/portions Follow up at next visit  Vitamin D deficiency Continue supplement for goal of 60-100 -     VITAMIN D 25 Hydroxy (Vit-D Deficiency, Fractures)  Medication management -     Magnesium  History of adenomatous polyp of colon Insurance will not pay prior to 10 years, she cannot afford to self pay Last report reviewed; no precancerous changes on pathology, single polyp She will follow up with insurance again this year to question coverage Consider hemoccult though discussed this will only catch very late stage cancer; defer for now  Discussed med's effects and SE's. Screening labs and tests as requested with regular follow-up as recommended. Over 40 minutes of exam, counseling, chart review, and complex, high  level critical decision making was performed this visit.   Future Appointments  Date Time Provider [place]  10/11/2020  3:00 PM Liane Comber, NP [facility] None     HPI  59 y.o. female  presents for a complete physical and follow up for has Hyperlipidemia; Hypertension; Obesity (BMI 30.0-34.9); History of adenomatous polyp of colon; Other abnormal glucose (prediabetes); and Vitamin D deficiency on their problem list.   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 She has noted since working from home has noted significantly reduced eye dryness and burning since working from home. Saw eye provider who noted inflammation but no infection, had despite avoiding vents blowing on her at work, concern for possible allergic exposure in work building. She plans to continue to work from home.   Single, no children. She sees GYN annually, Dr. Willis Modena, typically in the fall.   Mom has alzheimer's with sig decline in the last year, still living at home but only remembers family 10% of the time. She takes turns with her sisters staying with her and helping.   BMI is Body mass index is 33.92 kg/m., she has been working on diet and exercise.  She is watching sugars/carbs,  She is back to the gym, 3-4 days a week, doing cardio and weights, also walks in the morning on weekends Drinking lots of water - 64+ fluid ounces daily  Cutting excess carb, doing carb master yogurt,  Did restart fruit, trying to get at least 2-3 servings of fruits/veg daily  Grilled/baked chicken, shrimp, [place]. Doing almond milk. Wt Readings from Last 3 Encounters:  10/06/19 193 lb (87.5 kg)  10/01/18 190 lb (86.2 kg)  06/03/18 191 lb 9.6 oz (86.9 kg)   Her blood pressure has been controlled at home, today their BP is BP: 124/82 She does workout, walking a lot. She denies chest pain, shortness of breath, dizziness.   She is not on cholesterol medication and denies myalgias. Her cholesterol  is at goal. The cholesterol last visit was:   Lab Results  Component Value Date   CHOL 173 10/01/2018   HDL 66 10/01/2018   LDLCALC 90 10/01/2018   TRIG 84 10/01/2018   CHOLHDL 2.6 10/01/2018   She has been working on diet and exercise for prediabetes,  and denies paresthesia of the feet, polydipsia, polyuria and visual disturbances. Last A1C in the office was:  Lab Results  Component Value Date   HGBA1C 5.9 (H) 06/03/2018   Lab Results  Component Value Date   GFRAA 96 10/01/2018   Patient is on Vitamin D supplement, 5000 IU  Lab Results  Component Value Date   VD25OH 76 10/01/2018       Current Medications:  Current Outpatient Medications on File Prior to Visit  Medication Sig Dispense Refill  . albuterol (VENTOLIN HFA) 108 (90 Base) MCG/ACT inhaler Inhale 1-2 puffs into the lungs every 4 (four) hours as needed for wheezing or shortness of breath. 18 g 0  . Ascorbic Acid (VITAMIN C) 1000 MG tablet Take 1,000 mg by mouth daily.    Marland Kitchen aspirin 81 MG tablet Take 81 mg by mouth daily.    . Cholecalciferol (VITAMIN D-3) 5000 UNITS TABS Take 5,000 Units by mouth daily.    . clotrimazole-betamethasone (LOTRISONE) cream Apply 1 application topically 2 (two) times daily. (Patient taking differently: Apply 1 application topically as needed. ) 15 g 2  . glucosamine-chondroitin 500-400 MG tablet Take 1 tablet by mouth daily.    . halobetasol (ULTRAVATE) 0.05 % ointment Apply topically 2 (two) times daily. To eczema plaques. Do no use on face/hands/underarms. 50 g 0  . Lifitegrast (XIIDRA) 5 % SOLN Apply to eye 2 (two) times daily.    . Magnesium Chloride (MAGNESIUM DR PO) Take 250 mg by mouth daily.     . Multiple Vitamin (MULTIVITAMIN) tablet Take 1 tablet by mouth daily.    . promethazine-dextromethorphan (PROMETHAZINE-DM) 6.25-15 MG/5ML syrup Take 5 mLs by mouth 4 (four) times daily as needed for cough. 240 mL 1  . vitamin E 400 UNIT capsule Take 400 Units by mouth daily.     No  current facility-administered medications on file prior to visit.   Allergies:  Allergies  Allergen Reactions  . Levaquin [Levofloxacin In D5w] Nausea Only  . Maxitrol [Neomycin-Polymyxin-Dexameth]     Distorted patient's vision for over a month  . Phentermine   . Prilosec [Omeprazole] Rash   Medical History:  She has Hyperlipidemia; Hypertension; Obesity (BMI 30.0-34.9); History of adenomatous polyp of colon; Other abnormal glucose (prediabetes); and Vitamin D deficiency on their problem list.   Health Maintenance:   Immunization History  Administered Date(s) Administered  . Influenza-Unspecified 12/07/2013, 01/06/2015, 11/25/2015, 12/08/2017  . PFIZER SARS-COV-2 Vaccination 06/11/2019, 09/08/2019  . PPD Test 08/19/2013  . Tdap 01/12/2010   Tetanus: 2011 DUE  Pneumonia: N/A Prevnar due age 34 Shingles vaccine: 1/2 shingrix this last week  Flu vaccine: 2019 Covid 19: 2/2, 2021, pfizer  Pap: 2019,  Has annually at [facility] MGM: 09/2019 Colonoscopy: 2014, due follow up 10/2017 - patient attempted to schedule but was told insurance would not pay until 10 years  and cannot afford to pay Reviewed last - polyp without precancerous changes  Last Eye Exam: [facility], 2020, contacts/glasses Last dental exam: Dental works on [employer], 06/2017, goes q34m, overdue  Last derm: 2019  Patient Care Team: Unk Pinto, MD as PCP - General (Internal Medicine) Deneise Lever, MD as Consulting Physician (Pulmonary Disease) Irene Shipper, MD as Consulting Physician (Gastroenterology) Lavonna Monarch, MD as Consulting Physician (Dermatology)  Surgical History:  She has a past surgical history that includes Diagnostic laparoscopy (1998). Family History:  Herfamily history includes Alzheimer's disease (age of onset: 24) in her mother; Diabetes in her maternal grandfather, maternal grandmother, paternal grandfather, and paternal grandmother; Hypertension in her mother; Lung cancer  (age of onset: 61) in her father. Social History:  She reports that she has never smoked. She has never used smokeless tobacco. She reports that she does not drink alcohol and does not use drugs.  Review of Systems: Review of Systems  Constitutional: Negative.  Negative for malaise/fatigue and weight loss.  HENT: Negative.  Negative for hearing loss and tinnitus.   Eyes: Negative.  Negative for blurred vision and double vision.  Respiratory: Negative.  Negative for cough, sputum production, shortness of breath and wheezing.   Cardiovascular: Negative.  Negative for chest pain, palpitations, orthopnea, claudication, leg swelling and PND.  Gastrointestinal: Negative.  Negative for abdominal pain, blood in stool, constipation, diarrhea, heartburn, melena, nausea and vomiting.  Genitourinary: Negative.   Musculoskeletal: Negative.  Negative for falls, joint pain and myalgias.  Skin: Negative for itching and rash.  Neurological: Negative.  Negative for dizziness, tingling, sensory change, weakness and headaches.  Endo/Heme/Allergies: Negative.  Negative for polydipsia.  Psychiatric/Behavioral: Negative.  Negative for depression, memory loss, substance abuse and suicidal ideas. The patient is not nervous/anxious and does not have insomnia.   All other systems reviewed and are negative.   Physical Exam: Estimated body mass index is 33.92 kg/m as calculated from the following:   Height as of this encounter: 5' 3.25" (1.607 m).   Weight as of this encounter: 193 lb (87.5 kg). BP 124/82   Pulse 58   Temp (!) 97.3 F (36.3 C)   Ht 5' 3.25" (1.607 m)   Wt 193 lb (87.5 kg)   SpO2 98%   BMI 33.92 kg/m  General Appearance: Well nourished, in no apparent distress.  Eyes: PERRLA, EOMs, conjunctiva no swelling or erythema Sinuses: No Frontal/maxillary tenderness  ENT/Mouth: Ext aud canals clear, normal light reflex with TMs without erythema, bulging. Good dentition. No erythema, swelling, or  exudate on post pharynx. Tonsils not swollen or erythematous. Hearing normal.  Neck: Supple, thyroid normal. No bruits  Respiratory: Respiratory effort normal, BS equal bilaterally without rales, rhonchi, wheezing or stridor.  Cardio: RRR without murmurs, rubs or gallops. Brisk peripheral pulses without edema.  Chest: symmetric, with normal excursions and percussion.  Breasts: defer GYN Abdomen: Soft, nontender, no guarding, rebound, hernias, masses, or organomegaly.  Lymphatics: Non tender without lymphadenopathy.  Genitourinary: defer to GYN Musculoskeletal: Full ROM all peripheral extremities,5/5 strength, and normal gait.  Skin: Warm, dry without rashes, lesions, ecchymosis. Neuro: Cranial nerves intact, reflexes equal bilaterally. Normal muscle tone, no cerebellar symptoms. Sensation intact.  Psych: Awake and oriented X 3, normal affect, Insight and Judgment appropriate.    EKG: Sinus brady, WNL  Gorden Harms Dru [place] 3:23 PM [facility]

## 2019-10-06 NOTE — Patient Instructions (Signed)
Heather Walton , Thank you for taking time to come for your Annual Wellness Visit. I appreciate your ongoing commitment to your health goals. Please review the following plan we discussed and let me know if I can assist you in the future.   These are the goals we discussed: Goals    . DIET - EAT MORE FRUITS AND VEGETABLES     Aim for 7+ servings (1/2 cup each) of fruits and vegetables daily     . Exercise 150 min/wk Moderate Activity       This is a list of the screening recommended for you and due dates:  Health Maintenance  Topic Date Due  .  Hepatitis C: One time screening is recommended by Center for Disease Control  (CDC) for  adults born from [birth year] through 1965.   Never done  . HIV Screening  Never done  . Pap Smear  Never done  . Colon Cancer Screening  11/06/2017  . Flu Shot  10/10/2019  . Tetanus Vaccine  01/13/2020  . Mammogram  09/08/2021  . COVID-19 Vaccine  Completed      Know what a healthy weight is for you and aim to maintain this  Aim for 7+ servings of fruits and vegetables daily  65-80+ fluid ounces of water or unsweet tea for healthy kidneys  Limit to max 1 drink of alcohol per day; avoid smoking/tobacco  Limit animal fats in diet for cholesterol and heart health - choose grass fed whenever available  Avoid highly processed foods, and foods high in saturated/trans fats  Aim for low stress - take time to unwind and care for your mental health  Aim for 150 min of moderate intensity exercise weekly for heart health, and weights twice weekly for bone health  Aim for 7-9 hours of sleep daily .  A great goal to work towards is aiming to get in a serving daily of some of the most nutritionally dense foods - G- BOMBS daily           High-Fiber Diet Fiber, also called dietary fiber, is a type of carbohydrate that is found in fruits, vegetables, whole grains, and beans. A high-fiber diet can have many health benefits. Your health care provider may  recommend a high-fiber diet to help:  Prevent constipation. Fiber can make your bowel movements more regular.  Lower your cholesterol.  Relieve the following conditions: ? Swelling of veins in the anus (hemorrhoids). ? Swelling and irritation (inflammation) of specific areas of the digestive tract (uncomplicated diverticulosis). ? A problem of the large intestine (colon) that sometimes causes pain and diarrhea (irritable bowel syndrome, IBS).  Prevent overeating as part of a weight-loss plan.  Prevent heart disease, type 2 diabetes, and certain cancers. What is my plan? The recommended daily fiber intake in grams (g) includes:  38 g for men age 94 or younger.  30 g for men over age 38.  45 g for women age 40 or younger.  21 g for women over age 62. You can get the recommended daily intake of dietary fiber by:  Eating a variety of fruits, vegetables, grains, and beans.  Taking a fiber supplement, if it is not possible to get enough fiber through your diet. What do I need to know about a high-fiber diet?  It is better to get fiber through food sources rather than from fiber supplements. There is not a lot of research about how effective supplements are.  Always check the fiber content on  the nutrition facts label of any prepackaged food. Look for foods that contain 5 g of fiber or more per serving.  Talk with a diet and nutrition specialist (dietitian) if you have questions about specific foods that are recommended or not recommended for your medical condition, especially if those foods are not listed below.  Gradually increase how much fiber you consume. If you increase your intake of dietary fiber too quickly, you may have bloating, cramping, or gas.  Drink plenty of water. Water helps you to digest fiber. What are tips for following this plan?  Eat a wide variety of high-fiber foods.  Make sure that half of the grains that you eat each day are whole grains.  Eat breads  and cereals that are made with whole-grain flour instead of refined flour or white flour.  Eat brown rice, bulgur wheat, or millet instead of white rice.  Start the day with a breakfast that is high in fiber, such as a cereal that contains 5 g of fiber or more per serving.  Use beans in place of meat in soups, salads, and pasta dishes.  Eat high-fiber snacks, such as berries, raw vegetables, nuts, and popcorn.  Choose whole fruits and vegetables instead of processed forms like juice or sauce. What foods can I eat?  Fruits Berries. Pears. Apples. Oranges. Avocado. Prunes and raisins. Dried figs. Vegetables Sweet potatoes. Spinach. Kale. Artichokes. Cabbage. Broccoli. Cauliflower. Green peas. Carrots. Squash. Grains Whole-grain breads. Multigrain cereal. Oats and oatmeal. Brown rice. Barley. Bulgur wheat. [place]. Quinoa. Bran muffins. Popcorn. Rye wafer crackers. Meats and other proteins Navy, kidney, and pinto beans. Soybeans. Split peas. Lentils. Nuts and seeds. Dairy Fiber-fortified yogurt. Beverages Fiber-fortified soy milk. Fiber-fortified orange juice. Other foods Fiber bars. The items listed above may not be a complete list of recommended foods and beverages. Contact a dietitian for more options. What foods are not recommended? Fruits Fruit juice. Cooked, strained fruit. Vegetables Fried potatoes. Canned vegetables. Well-cooked vegetables. Grains White bread. Pasta made with refined flour. White rice. Meats and other proteins Fatty cuts of meat. Fried chicken or fried fish. Dairy Milk. Yogurt. Cream cheese. Sour cream. Fats and oils Butters. Beverages Soft drinks. Other foods Cakes and pastries. The items listed above may not be a complete list of foods and beverages to avoid. Contact a dietitian for more information. Summary  Fiber is a type of carbohydrate. It is found in fruits, vegetables, whole grains, and beans.  There are many health benefits of eating a  high-fiber diet, such as preventing constipation, lowering blood cholesterol, helping with weight loss, and reducing your risk of heart disease, diabetes, and certain cancers.  Gradually increase your intake of fiber. Increasing too fast can result in cramping, bloating, and gas. Drink plenty of water while you increase your fiber.  The best sources of fiber include whole fruits and vegetables, whole grains, nuts, seeds, and beans. This information is not intended to replace advice given to you by your health care provider. Make sure you discuss any questions you have with your health care provider. Document Revised: 12/30/2016 Document Reviewed: 12/30/2016 Elsevier Patient Education  2020 [employer].

## 2019-10-07 LAB — LIPID PANEL
Cholesterol: 140 mg/dL (ref <200)
HDL: 59 mg/dL (ref >=50)
LDL Cholesterol (Calc): 66 mg/dL (calc)
Non-HDL Cholesterol (Calc): 81 mg/dL (calc) (ref <130)
Total CHOL/HDL Ratio: 2.4 (calc) (ref <5.0)
Triglycerides: 70 mg/dL (ref <150)

## 2019-10-07 LAB — CBC WITH DIFFERENTIAL/PLATELET
Absolute Monocytes: 470 cells/uL (ref 200–950)
Basophils Absolute: 42 cells/uL (ref 0–200)
Basophils Relative: 0.9 %
Eosinophils Absolute: 42 cells/uL (ref 15–500)
Eosinophils Relative: 0.9 %
HCT: 37.6 % (ref 35.0–45.0)
Hemoglobin: 12.5 g/dL (ref 11.7–15.5)
Lymphs Abs: 1842 cells/uL (ref 850–3900)
MCH: 30.4 pg (ref 27.0–33.0)
MCHC: 33.2 g/dL (ref 32.0–36.0)
MCV: 91.5 fL (ref 80.0–100.0)
MPV: 10.7 fL (ref 7.5–12.5)
Monocytes Relative: 10 %
Neutro Abs: 2303 cells/uL (ref 1500–7800)
Neutrophils Relative %: 49 %
Platelets: 256 10*3/uL (ref 140–400)
RBC: 4.11 10*6/uL (ref 3.80–5.10)
RDW: 12.1 % (ref 11.0–15.0)
Total Lymphocyte: 39.2 %
WBC: 4.7 10*3/uL (ref 3.8–10.8)

## 2019-10-07 LAB — HEMOGLOBIN A1C
Hgb A1c MFr Bld: 6 % of total Hgb — ABNORMAL HIGH (ref <5.7)
Mean Plasma Glucose: 126 (calc)
eAG (mmol/L): 7 (calc)

## 2019-10-07 LAB — COMPLETE METABOLIC PANEL WITH GFR
AG Ratio: 1.3 (calc) (ref 1.0–2.5)
ALT: 13 U/L (ref 6–29)
AST: 16 U/L (ref 10–35)
Albumin: 4.1 g/dL (ref 3.6–5.1)
Alkaline phosphatase (APISO): 51 U/L (ref 37–153)
BUN: 12 mg/dL (ref 7–25)
CO2: 28 mmol/L (ref 20–32)
Calcium: 9.6 mg/dL (ref 8.6–10.4)
Chloride: 105 mmol/L (ref 98–110)
Creat: 0.78 mg/dL (ref 0.50–1.05)
GFR, Est African American: 96 mL/min/{1.73_m2} (ref >=60)
GFR, Est Non African American: 83 mL/min/{1.73_m2} (ref >=60)
Globulin: 3.2 g/dL (calc) (ref 1.9–3.7)
Glucose, Bld: 112 mg/dL — ABNORMAL HIGH (ref 65–99)
Potassium: 4.5 mmol/L (ref 3.5–5.3)
Sodium: 139 mmol/L (ref 135–146)
Total Bilirubin: 0.2 mg/dL (ref 0.2–1.2)
Total Protein: 7.3 g/dL (ref 6.1–8.1)

## 2019-10-07 LAB — VITAMIN D 25 HYDROXY (VIT D DEFICIENCY, FRACTURES): Vit D, 25-Hydroxy: 41 ng/mL (ref 30–100)

## 2019-10-07 LAB — URINALYSIS, ROUTINE W REFLEX MICROSCOPIC
Bilirubin Urine: NEGATIVE
Glucose, UA: NEGATIVE
Hgb urine dipstick: NEGATIVE
Ketones, ur: NEGATIVE
Leukocytes,Ua: NEGATIVE
Nitrite: NEGATIVE
Protein, ur: NEGATIVE
Specific Gravity, Urine: 1.005 (ref 1.001–1.03)
pH: 6.5 (ref 5.0–8.0)

## 2019-10-07 LAB — MAGNESIUM: Magnesium: 2.3 mg/dL (ref 1.5–2.5)

## 2019-10-07 LAB — TSH: TSH: 0.53 mIU/L (ref 0.40–4.50)

## 2019-12-02 DIAGNOSIS — H10413 Chronic giant papillary conjunctivitis, bilateral: Secondary | ICD-10-CM | POA: Diagnosis not present

## 2020-01-24 DIAGNOSIS — Z1389 Encounter for screening for other disorder: Secondary | ICD-10-CM | POA: Diagnosis not present

## 2020-01-24 DIAGNOSIS — Z01419 Encounter for gynecological examination (general) (routine) without abnormal findings: Secondary | ICD-10-CM | POA: Diagnosis not present

## 2020-01-24 DIAGNOSIS — Z6834 Body mass index (BMI) 34.0-34.9, adult: Secondary | ICD-10-CM | POA: Diagnosis not present

## 2020-01-24 DIAGNOSIS — Z130 Encounter for screening for diseases of the blood and blood-forming organs and certain disorders involving the immune mechanism: Secondary | ICD-10-CM | POA: Diagnosis not present

## 2020-04-06 ENCOUNTER — Ambulatory Visit: Payer: BC Managed Care – PPO | Admitting: Adult Health

## 2020-04-20 ENCOUNTER — Other Ambulatory Visit: Payer: Self-pay

## 2020-04-20 ENCOUNTER — Ambulatory Visit (INDEPENDENT_AMBULATORY_CARE_PROVIDER_SITE_OTHER): Payer: BC Managed Care – PPO | Admitting: Adult Health

## 2020-04-20 ENCOUNTER — Encounter: Payer: Self-pay | Admitting: Adult Health

## 2020-04-20 VITALS — BP 134/84 | HR 60 | Temp 97.5°F | Wt 191.0 lb

## 2020-04-20 DIAGNOSIS — Z79899 Other long term (current) drug therapy: Secondary | ICD-10-CM

## 2020-04-20 DIAGNOSIS — E559 Vitamin D deficiency, unspecified: Secondary | ICD-10-CM

## 2020-04-20 DIAGNOSIS — E785 Hyperlipidemia, unspecified: Secondary | ICD-10-CM

## 2020-04-20 DIAGNOSIS — E669 Obesity, unspecified: Secondary | ICD-10-CM | POA: Diagnosis not present

## 2020-04-20 DIAGNOSIS — I10 Essential (primary) hypertension: Secondary | ICD-10-CM

## 2020-04-20 DIAGNOSIS — H6121 Impacted cerumen, right ear: Secondary | ICD-10-CM | POA: Diagnosis not present

## 2020-04-20 DIAGNOSIS — R7309 Other abnormal glucose: Secondary | ICD-10-CM | POA: Diagnosis not present

## 2020-04-20 MED ORDER — HALOBETASOL PROPIONATE 0.05 % EX OINT: TOPICAL_OINTMENT | Freq: Two times a day (BID) | CUTANEOUS | 0 refills | Status: DC

## 2020-04-20 NOTE — Patient Instructions (Signed)
Goals    . DIET - EAT MORE FRUITS AND VEGETABLES     Aim for 7+ servings (1/2 cup each) of fruits and vegetables daily     . Exercise 150 min/wk Moderate Activity        Use a dropper or use a cap to put peroxide, olive oil,mineral oil or canola oil in the effected ear- 2-3 times a week. Let it soak for 20-30 min then you can take a shower or use a baby bulb with warm water to wash out the ear wax.  Can buy debrox wax removal kit over the counter.  Do not use Qtips    High-Fiber Eating Plan Fiber, also called dietary fiber, is a type of carbohydrate. It is found foods such as fruits, vegetables, whole grains, and beans. A high-fiber diet can have many health benefits. Your health care provider may recommend a high-fiber diet to help:  Prevent constipation. Fiber can make your bowel movements more regular.  Lower your cholesterol.  Relieve the following conditions: ? Inflammation of veins in the anus (hemorrhoids). ? Inflammation of specific areas of the digestive tract (uncomplicated diverticulosis). ? A problem of the large intestine, also called the colon, that sometimes causes pain and diarrhea (irritable bowel syndrome, or IBS).  Prevent overeating as part of a weight-loss plan.  Prevent heart disease, type 2 diabetes, and certain cancers. What are tips for following this plan? Reading food labels  Check the nutrition facts label on food products for the amount of dietary fiber. Choose foods that have 5 grams of fiber or more per serving.  The goals for recommended daily fiber intake include: ? Men (age 54 or younger): 34-38 g. ? Men (over age 74): 28-34 g. ? Women (age 67 or younger): 25-28 g. ? Women (over age 41): 22-25 g. Your daily fiber goal is _____________ g.   Shopping  Choose whole fruits and vegetables instead of processed forms, such as apple juice or applesauce.  Choose a wide variety of high-fiber foods such as avocados, lentils, oats, and kidney  beans.  Read the nutrition facts label of the foods you choose. Be aware of foods with added fiber. These foods often have high sugar and sodium amounts per serving. Cooking  Use whole-grain flour for baking and cooking.  Cook with brown rice instead of white rice. Meal planning  Start the day with a breakfast that is high in fiber, such as a cereal that contains 5 g of fiber or more per serving.  Eat breads and cereals that are made with whole-grain flour instead of refined flour or white flour.  Eat brown rice, bulgur wheat, or millet instead of white rice.  Use beans in place of meat in soups, salads, and pasta dishes.  Be sure that half of the grains you eat each day are whole grains. General information  You can get the recommended daily intake of dietary fiber by: ? Eating a variety of fruits, vegetables, grains, nuts, and beans. ? Taking a fiber supplement if you are not able to take in enough fiber in your diet. It is better to get fiber through food than from a supplement.  Gradually increase how much fiber you consume. If you increase your intake of dietary fiber too quickly, you may have bloating, cramping, or gas.  Drink plenty of water to help you digest fiber.  Choose high-fiber snacks, such as berries, raw vegetables, nuts, and popcorn. What foods should I eat? Fruits Berries. Pears. Apples.  Oranges. Avocado. Prunes and raisins. Dried figs. Vegetables Sweet potatoes. Spinach. Kale. Artichokes. Cabbage. Broccoli. Cauliflower. Green peas. Carrots. Squash. Grains Whole-grain breads. Multigrain cereal. Oats and oatmeal. Brown rice. Barley. Bulgur wheat. [place]. Quinoa. Bran muffins. Popcorn. Rye wafer crackers. Meats and other proteins Navy beans, kidney beans, and pinto beans. Soybeans. Split peas. Lentils. Nuts and seeds. Dairy Fiber-fortified yogurt. Beverages Fiber-fortified soy milk. Fiber-fortified orange juice. Other foods Fiber bars. The items listed  above may not be a complete list of recommended foods and beverages. Contact a dietitian for more information. What foods should I avoid? Fruits Fruit juice. Cooked, strained fruit. Vegetables Fried potatoes. Canned vegetables. Well-cooked vegetables. Grains White bread. Pasta made with refined flour. White rice. Meats and other proteins Fatty cuts of meat. Fried chicken or fried fish. Dairy Milk. Yogurt. Cream cheese. Sour cream. Fats and oils Butters. Beverages Soft drinks. Other foods Cakes and pastries. The items listed above may not be a complete list of foods and beverages to avoid. Talk with your dietitian about what choices are best for you. Summary  Fiber is a type of carbohydrate. It is found in foods such as fruits, vegetables, whole grains, and beans.  A high-fiber diet has many benefits. It can help to prevent constipation, lower blood cholesterol, aid weight loss, and reduce your risk of heart disease, diabetes, and certain cancers.  Increase your intake of fiber gradually. Increasing fiber too quickly may cause cramping, bloating, and gas. Drink plenty of water while you increase the amount of fiber you consume.  The best sources of fiber include whole fruits and vegetables, whole grains, nuts, seeds, and beans. This information is not intended to replace advice given to you by your health care provider. Make sure you discuss any questions you have with your health care provider. Document Revised: 07/01/2019 Document Reviewed: 07/01/2019 Elsevier Patient Education  2021 [employer].

## 2020-04-20 NOTE — Progress Notes (Signed)
FOLLOW UP  Assessment and Plan:   Hypertension Fairly controlled by lifestyle Monitor blood pressure at home; patient to call if consistently greater than 130/80 Continue DASH diet.   Reminder to go to the ER if any CP, SOB, nausea, dizziness, severe HA, changes vision/speech, left arm numbness and tingling and jaw pain.  Cholesterol Currently at goal; well controlled by lifestyle Continue low cholesterol diet and exercise.  Check lipid panel at CPE  Prediabetes Discussed disease and risks Discussed diet/exercise, weight management  A1C  Obesity with co morbidities Long discussion about weight loss, diet, and exercise Recommended diet heavy in fruits and veggies and low in animal meats, cheeses, and dairy products, appropriate calorie intake Discussed ideal weight for height  Continue to monitor  Vitamin D Def Increase from 5000 IU daily, every other day increase to 10000 IU continue supplementation to maintain goal of 60-100 Defer Vit D level  Continue diet and meds as discussed. Further disposition pending results of labs. Discussed med's effects and SE's.   Over 30 minutes of exam, counseling, chart review, and critical decision making was performed.   Future Appointments  Date Time Provider [place]  10/11/2020  3:00 PM Liane Comber, NP [facility] None    ----------------------------------------------------------------------------------------------------------------------  HPI 60 y.o. female  presents for 6 month follow up on hypertension, prediabetes, weight and vitamin D deficiency.  BMI is Body mass index is 33.57 kg/m., she has been working on diet and exercise, has been going to the gym 3-4 days a week. Admits didn't do as well over the holidays.  Wt Readings from Last 3 Encounters:  04/20/20 191 lb (86.6 kg)  10/06/19 193 lb (87.5 kg)  10/01/18 190 lb (86.2 kg)   Her blood pressure has been controlled at home (120-130s/80s), today their BP is  BP: 134/84  She does workout. She denies chest pain, shortness of breath, dizziness.   She is not on cholesterol medication. Her cholesterol is at goal. The cholesterol last visit was:   Lab Results  Component Value Date   CHOL 140 10/06/2019   HDL 59 10/06/2019   LDLCALC 66 10/06/2019   TRIG 70 10/06/2019   CHOLHDL 2.4 10/06/2019    She has been working on diet and exercise for prediabetes, and denies increased appetite, nausea, paresthesia of the feet, polydipsia, polyuria and visual disturbances. Last A1C in the office was:  Lab Results  Component Value Date   HGBA1C 6.0 (H) 10/06/2019   Patient is on Vitamin D supplement.   Lab Results  Component Value Date   VD25OH 41 10/06/2019        Current Medications:  Current Outpatient Medications on File Prior to Visit  Medication Sig  . Ascorbic Acid (VITAMIN C) 1000 MG tablet Take 1,000 mg by mouth daily.  Marland Kitchen aspirin 81 MG tablet Take 81 mg by mouth daily.  . Cholecalciferol (VITAMIN D-3) 5000 UNITS TABS Take 5,000 Units by mouth daily.  . clotrimazole-betamethasone (LOTRISONE) cream Apply 1 application topically 2 (two) times daily. (Patient taking differently: Apply 1 application topically as needed.)  . glucosamine-chondroitin 500-400 MG tablet Take 1 tablet by mouth daily.  . Lifitegrast 5 % SOLN Apply to eye 2 (two) times daily.  . Magnesium Chloride (MAGNESIUM DR PO) Take 250 mg by mouth daily.   . Multiple Vitamin (MULTIVITAMIN) tablet Take 1 tablet by mouth daily.  . vitamin E 400 UNIT capsule Take 400 Units by mouth daily.   No current facility-administered medications on file prior to  visit.     Allergies:  Allergies  Allergen Reactions  . Levaquin [Levofloxacin In D5w] Nausea Only  . Maxitrol [Neomycin-Polymyxin-Dexameth]     Distorted patient's vision for over a month  . Phentermine   . Prilosec [Omeprazole] Rash     Medical History:  Past Medical History:  Diagnosis Date  . DDD (degenerative disc  disease), lumbar   . GERD (gastroesophageal reflux disease)   . Hemorrhoid   . HSV-1 infection   . Hyperlipidemia   . Hypertension   . OSA (obstructive sleep apnea)   . Vitamin D deficiency    Family history- Reviewed and unchanged Social history- Reviewed and unchanged   Review of Systems:  Review of Systems  Constitutional: Negative for malaise/fatigue and weight loss.  HENT: Negative for hearing loss and tinnitus.   Eyes: Negative for blurred vision and double vision.  Respiratory: Negative for cough, shortness of breath and wheezing.   Cardiovascular: Negative for chest pain, palpitations, orthopnea, claudication and leg swelling.  Gastrointestinal: Negative for abdominal pain, blood in stool, constipation, diarrhea, heartburn, melena, nausea and vomiting.  Genitourinary: Negative.   Musculoskeletal: Negative for joint pain and myalgias.  Skin: Negative for rash.  Neurological: Negative for dizziness, tingling, sensory change, weakness and headaches.  Endo/Heme/Allergies: Negative for polydipsia.  Psychiatric/Behavioral: Negative.   All other systems reviewed and are negative.     Physical Exam: BP 134/84   Pulse 60   Temp (!) 97.5 F (36.4 C)   Wt 191 lb (86.6 kg)   SpO2 98%   BMI 33.57 kg/m  Wt Readings from Last 3 Encounters:  04/20/20 191 lb (86.6 kg)  10/06/19 193 lb (87.5 kg)  10/01/18 190 lb (86.2 kg)   General Appearance: Well nourished, in no apparent distress. Eyes: PERRLA, EOMs, conjunctiva no swelling or erythema Sinuses: No Frontal/maxillary tenderness ENT/Mouth: L Ext aud canals clear, R obstructed by soft wax,L TM without erythema, bulging. No erythema, swelling, or exudate on post pharynx.  Tonsils not swollen or erythematous. Hearing normal.  Neck: Supple, thyroid normal.  Respiratory: Respiratory effort normal, BS equal bilaterally without rales, rhonchi, wheezing or stridor.  Cardio: RRR with no MRGs. Brisk peripheral pulses without edema.   Abdomen: Soft, + BS.  Non tender, no guarding, rebound, hernias, masses. Lymphatics: Non tender without lymphadenopathy.  Musculoskeletal: Full ROM, 5/5 strength, Normal gait Skin: Warm, dry without rashes, lesions, ecchymosis.  Neuro: Cranial nerves intact. No cerebellar symptoms.  Psych: Awake and oriented X 3, normal affect, Insight and Judgment appropriate.    Izora Ribas, NP 4:38 PM [facility]

## 2020-04-21 LAB — COMPLETE METABOLIC PANEL WITH GFR
AG Ratio: 1.5 (calc) (ref 1.0–2.5)
ALT: 15 U/L (ref 6–29)
AST: 18 U/L (ref 10–35)
Albumin: 4.3 g/dL (ref 3.6–5.1)
Alkaline phosphatase (APISO): 49 U/L (ref 37–153)
BUN: 19 mg/dL (ref 7–25)
CO2: 29 mmol/L (ref 20–32)
Calcium: 10.2 mg/dL (ref 8.6–10.4)
Chloride: 105 mmol/L (ref 98–110)
Creat: 0.86 mg/dL (ref 0.50–1.05)
GFR, Est African American: 86 mL/min/{1.73_m2} (ref >=60)
GFR, Est Non African American: 74 mL/min/{1.73_m2} (ref >=60)
Globulin: 2.9 g/dL (calc) (ref 1.9–3.7)
Glucose, Bld: 90 mg/dL (ref 65–99)
Potassium: 5.3 mmol/L (ref 3.5–5.3)
Sodium: 139 mmol/L (ref 135–146)
Total Bilirubin: 0.3 mg/dL (ref 0.2–1.2)
Total Protein: 7.2 g/dL (ref 6.1–8.1)

## 2020-04-21 LAB — CBC WITH DIFFERENTIAL/PLATELET
Absolute Monocytes: 555 cells/uL (ref 200–950)
Basophils Absolute: 61 cells/uL (ref 0–200)
Basophils Relative: 1 %
Eosinophils Absolute: 43 cells/uL (ref 15–500)
Eosinophils Relative: 0.7 %
HCT: 42.8 % (ref 35.0–45.0)
Hemoglobin: 14.3 g/dL (ref 11.7–15.5)
Lymphs Abs: 2635 cells/uL (ref 850–3900)
MCH: 30.1 pg (ref 27.0–33.0)
MCHC: 33.4 g/dL (ref 32.0–36.0)
MCV: 90.1 fL (ref 80.0–100.0)
MPV: 10.6 fL (ref 7.5–12.5)
Monocytes Relative: 9.1 %
Neutro Abs: 2806 cells/uL (ref 1500–7800)
Neutrophils Relative %: 46 %
Platelets: 235 10*3/uL (ref 140–400)
RBC: 4.75 10*6/uL (ref 3.80–5.10)
RDW: 12.5 % (ref 11.0–15.0)
Total Lymphocyte: 43.2 %
WBC: 6.1 10*3/uL (ref 3.8–10.8)

## 2020-04-21 LAB — HEMOGLOBIN A1C
Hgb A1c MFr Bld: 6.2 % of total Hgb — ABNORMAL HIGH (ref <5.7)
Mean Plasma Glucose: 131 mg/dL
eAG (mmol/L): 7.3 mmol/L

## 2020-04-21 LAB — TSH: TSH: 0.66 mIU/L (ref 0.40–4.50)

## 2020-04-21 LAB — MAGNESIUM: Magnesium: 2.2 mg/dL (ref 1.5–2.5)

## 2020-07-26 ENCOUNTER — Other Ambulatory Visit: Payer: Self-pay | Admitting: Internal Medicine

## 2020-07-26 DIAGNOSIS — Z1231 Encounter for screening mammogram for malignant neoplasm of breast: Secondary | ICD-10-CM

## 2020-09-14 ENCOUNTER — Ambulatory Visit
Admission: RE | Admit: 2020-09-14 | Discharge: 2020-09-14 | Disposition: A | Discharge status: Home / Self Care | Payer: BC Managed Care – PPO | Source: Ambulatory Visit | Attending: Internal Medicine | Admitting: Internal Medicine

## 2020-09-14 ENCOUNTER — Other Ambulatory Visit: Payer: Self-pay

## 2020-09-14 DIAGNOSIS — Z1231 Encounter for screening mammogram for malignant neoplasm of breast: Secondary | ICD-10-CM

## 2020-10-11 ENCOUNTER — Encounter: Payer: Self-pay | Admitting: Adult Health

## 2020-10-11 ENCOUNTER — Ambulatory Visit (INDEPENDENT_AMBULATORY_CARE_PROVIDER_SITE_OTHER): Payer: BC Managed Care – PPO | Admitting: Adult Health

## 2020-10-11 ENCOUNTER — Other Ambulatory Visit: Payer: Self-pay

## 2020-10-11 VITALS — BP 122/74 | HR 62 | Temp 97.2°F | Ht 64.0 in | Wt 191.4 lb

## 2020-10-11 DIAGNOSIS — Z131 Encounter for screening for diabetes mellitus: Secondary | ICD-10-CM

## 2020-10-11 DIAGNOSIS — Z1389 Encounter for screening for other disorder: Secondary | ICD-10-CM

## 2020-10-11 DIAGNOSIS — H04123 Dry eye syndrome of bilateral lacrimal glands: Secondary | ICD-10-CM

## 2020-10-11 DIAGNOSIS — E559 Vitamin D deficiency, unspecified: Secondary | ICD-10-CM | POA: Diagnosis not present

## 2020-10-11 DIAGNOSIS — I10 Essential (primary) hypertension: Secondary | ICD-10-CM | POA: Diagnosis not present

## 2020-10-11 DIAGNOSIS — Z136 Encounter for screening for cardiovascular disorders: Secondary | ICD-10-CM

## 2020-10-11 DIAGNOSIS — R7309 Other abnormal glucose: Secondary | ICD-10-CM

## 2020-10-11 DIAGNOSIS — Z0000 Encounter for general adult medical examination without abnormal findings: Secondary | ICD-10-CM

## 2020-10-11 DIAGNOSIS — Z13220 Encounter for screening for lipoid disorders: Secondary | ICD-10-CM

## 2020-10-11 DIAGNOSIS — E669 Obesity, unspecified: Secondary | ICD-10-CM

## 2020-10-11 DIAGNOSIS — Z0001 Encounter for general adult medical examination with abnormal findings: Secondary | ICD-10-CM

## 2020-10-11 DIAGNOSIS — Z86010 Personal history of colonic polyps: Secondary | ICD-10-CM

## 2020-10-11 NOTE — Patient Instructions (Signed)
[name] , Thank you for taking time to come for your Annual Wellness Visit. I appreciate your ongoing commitment to your health goals. Please review the following plan we discussed and let me know if I can assist you in the future.   These are the goals we discussed:  Goals      DIET - EAT MORE FRUITS AND VEGETABLES     Aim for 7+ servings (1/2 cup each) of fruits and vegetables daily       Exercise 150 min/wk Moderate Activity        This is a list of the screening recommended for you and due dates:  Health Maintenance  Topic Date Due   Zoster (Shingles) Vaccine (2 of 2) 11/27/2019   Flu Shot  10/09/2020   Colon Cancer Screening  10/11/2021*   Pneumococcal Vaccination (1 - PCV) 10/11/2025*   Hepatitis C Screening: USPSTF Recommendation to screen - Ages 18-79 yo.  10/11/2025*   COVID-19 Vaccine (5 - Booster for Pfizer series) 10/16/2020   Pap Smear  11/30/2021   Mammogram  09/15/2022   Tetanus Vaccine  10/05/2029   HPV Vaccine  Aged Out   HIV Screening  Discontinued  *Topic was postponed. The date shown is not the original due date.     Know what a healthy weight is for you (roughly BMI <25) and aim to maintain this  Aim for 7+ servings of fruits and vegetables daily  65-80+ fluid ounces of water or unsweet tea for healthy kidneys  Limit to max 1 drink of alcohol per day; avoid smoking/tobacco  Limit animal fats in diet for cholesterol and heart health - choose grass fed whenever available  Avoid highly processed foods, and foods high in saturated/trans fats  Aim for low stress - take time to unwind and care for your mental health  Aim for 150 min of moderate intensity exercise weekly for heart health, and weights twice weekly for bone health  Aim for 7-9 hours of sleep daily    Preventing Colorectal Cancer Colorectal cancer is a cancerous (malignant) tumor in the colon or rectum, which are parts of the large intestine. A tumor is a mass of cells or tissue.  If this cancer is not found early, it can spread to other parts of the body and can be life-threatening. It is one of the most commonly diagnosed types of cancer and a leading cause of death related tocancer. Colorectal cancer cannot always be prevented, but you can take steps to loweryour risk of developing it. How can this condition affect me? Most often, colorectal cancer starts as abnormal growths called polyps on the inner wall of the colon or rectum. Left untreated, these polyps can develop into cancer. You can help prevent colorectal cancer by having screening tests to check for these early signs of cancer. During screening, polyps may be removed to be checked for cancer cells and possibly to prevent them from becoming cancerous. If you delay recommended screenings, it is possible thatany existing cancer will grow and spread before it is found. Along with regular screenings, making changes to your diet and lifestyle canhelp prevent colorectal cancer. What can increase my risk? Certain factors may make you more likely to develop this condition. Risk factors that can be changed Being inactive (sedentary). Being overweight or obese. Having a diet that: Is high in red meat, precooked or cured meat, or other processed meat, and high in fat (especially animal fat). Is low in sources of fiber, such  as fruits, vegetables, and whole grains. Includes large amounts of sugar-sweetened beverages. Drinking too much alcohol. Smoking. Risk factors that cannot be changed Being older than age 29. Having a personal or family history of colorectal cancer or polyps in your colon. Having a type of genetic syndrome that is passed from parent to child (hereditary), such as: Lynch syndrome. Familial adenomatous polyposis. Turcot syndrome. Peutz-Jeghers syndrome. MUTYH-associated polyposis (MAP). Having certain other conditions, such as: Inflammatory bowel disease. This includes ulcerative colitis or Crohn's  disease. Diabetes. A history of cancer that was treated with radiation to the abdomen or the area between the hip bones (pelvic area). What actions can I take to prevent this? Nutrition  Eat plenty of fruits and vegetables. Try to eat at least: 1-2 cups of fruit each day. 2-3 cups of vegetables each day. Eat whole grains, which are grains that have not been processed. They include oats, whole wheat, bulgur, brown rice, quinoa, and millet. You should eat 6-8 oz (171-227 g) of grains each day. Use a kitchen scale to measure these amounts. Eat less red meat. Instead, choose lean sources of protein, such as beans, tofu, fish, and chicken. Avoid precooked or cured meat, such as sausages or meat loaves. Avoid frying and cooking meat at high heat. Use other methods of cooking, such as steaming or sauting.  Lifestyle Do not use any products that contain nicotine or tobacco, such as cigarettes, e-cigarettes, and chewing tobacco. If you need help quitting, ask your health care provider. If you drink alcohol: Limit how much you use to: 0-1 drink a day for women who are not pregnant. 0-2 drinks a day for men. Be aware of how much alcohol is in your drink. In the [place] one drink equals one 12 oz bottle of beer (355 mL), one 5 oz glass of wine (148 mL), or one 1 oz glass of hard liquor (44 mL). If you are overweight or obese, work with your health care provider to lose weight. Exercise. Each week, aim for at least 150 minutes of moderate exercise (such as walking, biking, or yoga) or 75 minutes of vigorous exercise (such as running or swimming). Ask your health care provider how much physical activity is best for you. Getting screened Have regular screenings as often as recommended by your health care provider. All adults should have screenings starting at age 42 and continuing until age 46. Your health care provider may recommend screening before age 87. You will have tests every 1-10 years, depending  on your results and the type of screening test. People at increased risk should start screening at an earlier age. There are several types of screening tests, such as: Tests to check a stool (feces) sample for blood or for DNA changes that could lead to cancer. Sigmoidoscopy. During this test, a flexible tube with a tiny camera (sigmoidoscope) is inserted through the opening between the buttocks (anus) and is used to examine the rectum and lower colon. Colonoscopy. During this test, a long, thin, flexible tube with a tiny camera (colonoscope) is used to examine the entire colon and rectum. Virtual colonoscopy. Instead of a colonoscope, this type of colonoscopy uses a CT scan and computers to take pictures of the colon and rectum. General information Ask your health care provider if you should take low-dose aspirin to help prevent polyps. Find out about your family's medical history. It is important to know whether colorectal cancer is in your family. If you have a family history of colorectal cancer,  ask to meet with a genetic counselor. Where to find support Talk with your health care provider about screenings, a healthy diet, and proper exercise. If you need help to quit smoking or using tobacco, talk to your health care provider or visit these websites: VirusCrisis.dk: smokefree.gov [place] Department of Health and Human Services: http://gray-george.biz/ Where to find more information American Cancer Society: cancer.[place]: cancer.gov Contact a health care provider if you have: Blood in your stool or in the toilet after a bowel movement. Discomfort, pain, bloating, fullness, or cramps in your abdomen. A change in your bowel habits. Unexplained weight loss. Nausea and vomiting. Constant tiredness (fatigue). Summary You can reduce your chances of getting colorectal cancer by getting recommended screenings and making certain diet and lifestyle changes. Eat plenty of fruits,  vegetables, and whole grains. Avoid red meats and processed meats. Do not use any products that contain nicotine or tobacco, such as cigarettes, e-cigarettes, and chewing tobacco. Aim for at least 150 minutes of moderate exercise or 75 minutes of vigorous exercise each week. Ask your health care provider how much activity is best for you. Get regular screenings as often as recommended by your health care provider. Most people should start having colonoscopies at age 63. This information is not intended to replace advice given to you by your health care provider. Make sure you discuss any questions you have with your healthcare provider. Document Revised: 06/16/2019 Document Reviewed: 06/16/2019 Elsevier Patient Education  [place].

## 2020-10-11 NOTE — Progress Notes (Signed)
Complete Physical  Assessment and Plan:  Encounter for Annual Physical Exam with abnormal findings Due annually  Health Maintenance reviewed Healthy lifestyle reviewed and goals set  Essential hypertension - continue medications, DASH diet, exercise and monitor at home. Call if greater than 130/80.  -     CBC with Differential/Platelet -     CMP/GFR -     Urinalysis, Routine w reflex microscopic -     Microalbumin / creatinine urine ratio -     EKG  Hyperlipidemia, unspecified hyperlipidemia type -continue medications, check lipids, decrease fatty foods, increase activity.  -     Lipid panel  PreDM Discussed general issues about diabetes pathophysiology and management., Educational material distributed., Suggested low cholesterol diet., Encouraged aerobic exercise., Discussed foot care., Reminded to get yearly retinal exam. Weight loss encouraged  Check A1C  Obesity -BMI 32 Long discussion about weight loss, diet, and exercise Recommended diet heavy in fruits and veggies and low in animal meats, cheeses, and dairy products, appropriate calorie intake Discussed appropriate weight for height  Continue excellent progress with exercise, diet/portions Follow up at next visit  Vitamin D deficiency -     Vitamin D  Medication management -     Magnesium  History of adenomatous polyp of colon Insurance will not pay prior to 10 years, she cannot afford to self pay Last report reviewed; no precancerous changes on pathology, single polyp Discussed alternative options, FIT testing; declines at this time  Discussed med's effects and SE's. Screening labs and tests as requested with regular follow-up as recommended. Over 40 minutes of exam, counseling, chart review, and complex, high level critical decision making was performed this visit.   Future Appointments  Date Time Provider [place]  10/11/2020  3:00 PM Liane Comber, NP [facility] None  10/11/2021  3:00 PM Liane Comber, NP [facility] None     HPI  60 y.o. female  presents for a complete physical and follow up for has Hypertension; Obesity (BMI 30.0-34.9); History of adenomatous polyp of colon; Other abnormal glucose (prediabetes); and Vitamin D deficiency on their problem list.   High deductible insurance, wants selective labs.   Single, no children. She sees GYN annually, Dr. Willis Modena, typically in the fall.   She works as Therapist, art by phone, from home. Noted reduced eye dryness and burning over covid and requested to continue remote work but slowly phasing back. Follows with ophth provider for dry eyes.   Mom has Alzheimer's with sig decline in the last few years, still living at home with step father, but only remembers family 10% of the time. She takes turns with her sisters staying with her and helping.   BMI is Body mass index is 32.85 kg/m., she has been working on diet and exercise, was doing well but gained a bit with stress, but has noted clothes fitting loser.  She is watching sugars/carbs,  She is back to the gym, 3-4 days a week, doing cardio and weights, also walks in the morning on weekends Drinking lots of water - 64+ fluid ounces daily  Cutting excess carb, doing carb master yogurt,  Did restart fruit, trying to get at least 2-3 servings of fruits/veg daily  Grilled/baked chicken, shrimp, [place]. Doing almond milk. Wt Readings from Last 3 Encounters:  10/11/20 191 lb 6.4 oz (86.8 kg)  04/20/20 191 lb (86.6 kg)  10/06/19 193 lb (87.5 kg)   Her blood pressure has been controlled at home, today their BP is BP: 122/74 She  does workout, walking a lot. She denies chest pain, shortness of breath, dizziness.   She is not on cholesterol medication and denies myalgias. Her cholesterol is at goal, on review, consistently at goal last 5 checks. The cholesterol last visit was:   Lab Results  Component Value Date   CHOL 140 10/06/2019   HDL 59 10/06/2019   LDLCALC 66 10/06/2019    TRIG 70 10/06/2019   CHOLHDL 2.4 10/06/2019   She has been working on diet and exercise for prediabetes,  and denies paresthesia of the feet, polydipsia, polyuria and visual disturbances.  Last A1C in the office was:  Lab Results  Component Value Date   HGBA1C 6.2 (H) 04/20/2020   Lab Results  Component Value Date   GFRAA 86 04/20/2020   Patient is on Vitamin D supplement, 5000 IU, takes 10000 IU 2-3 days a week Lab Results  Component Value Date   VD25OH 44 10/06/2019       Current Medications:  Current Outpatient Medications on File Prior to Visit  Medication Sig Dispense Refill   Ascorbic Acid (VITAMIN C) 1000 MG tablet Take 1,000 mg by mouth daily.     Cholecalciferol (VITAMIN D-3) 5000 UNITS TABS Take 5,000 Units by mouth daily.     clotrimazole-betamethasone (LOTRISONE) cream Apply 1 application topically 2 (two) times daily. (Patient taking differently: Apply 1 application topically as needed.) 15 g 2   halobetasol (ULTRAVATE) 0.05 % ointment Apply topically 2 (two) times daily. To eczema plaques. Do no use on face/hands/underarms. 50 g 0   Lifitegrast 5 % SOLN Apply to eye 2 (two) times daily.     Magnesium Chloride (MAGNESIUM DR PO) Take 250 mg by mouth daily.      Multiple Vitamin (MULTIVITAMIN) tablet Take 1 tablet by mouth daily.     vitamin E 400 UNIT capsule Take 400 Units by mouth daily.     No current facility-administered medications on file prior to visit.   Allergies:  Allergies  Allergen Reactions   Levaquin [Levofloxacin In D5w] Nausea Only   Maxitrol [Neomycin-Polymyxin-Dexameth]     Distorted patient's vision for over a month   Phentermine    Prilosec [Omeprazole] Rash   Medical History:  She has Hypertension; Obesity (BMI 30.0-34.9); History of adenomatous polyp of colon; Other abnormal glucose (prediabetes); and Vitamin D deficiency on their problem list.   Health Maintenance:   Immunization History  Administered Date(s) Administered    Influenza-Unspecified 12/07/2013, 01/06/2015, 11/25/2015, 12/08/2017   PFIZER(Purple Top)SARS-COV-2 Vaccination 06/11/2019, 09/08/2019   PPD Test 08/19/2013   Td 10/06/2019   Tdap 01/12/2010   Zoster Recombinat (Shingrix) 10/02/2019   Tetanus: 2021 Pneumonia: N/A Prevnar due age 59 Shingles vaccine: 2/2 shingrix -  Flu vaccine: 2021 get annually  Covid 19: 2/2, 2021, pfizer  Pap: 2019,  Has [facility], has scheduled fall 2022 MGM: 09/14/2020 Colonoscopy: 10/2012, 1 adenomatous polyp without high grade dysplasia, Dr. Henrene Pastor, due follow up 10/2017 - patient attempted to schedule but was told insurance would not pay until 10 years and cannot afford to pay  Last Eye Exam: [facility], 2022, contacts/glasses Last dental exam: Dental works on [employer], 06/2017, goes q1m overdue  Last derm: 2019  Patient Care Team: MUnk Pinto MD as PCP - General (Internal Medicine) YDeneise Lever MD as Consulting Physician (Pulmonary Disease) PIrene Shipper MD as Consulting Physician (Gastroenterology) TLavonna Monarch MD as Consulting Physician (Dermatology)  Surgical History:  She has a past surgical history that  includes Diagnostic laparoscopy (1998). Family History:  Herfamily history includes Alzheimer's disease (age of onset: 98) in her mother; Breast cancer in her cousin; Diabetes in her maternal grandfather, maternal grandmother, paternal grandfather, and paternal grandmother; Hypertension in her mother; Lung cancer (age of onset: 15) in her father. Social History:  She reports that she has never smoked. She has never used smokeless tobacco. She reports that she does not drink alcohol and does not use drugs.  Review of Systems: Review of Systems  Constitutional: Negative.  Negative for malaise/fatigue and weight loss.  HENT: Negative.  Negative for hearing loss and tinnitus.   Eyes: Negative.  Negative for blurred vision and double vision.  Respiratory: Negative.   Negative for cough, sputum production, shortness of breath and wheezing.   Cardiovascular: Negative.  Negative for chest pain, palpitations, orthopnea, claudication, leg swelling and PND.  Gastrointestinal: Negative.  Negative for abdominal pain, blood in stool, constipation, diarrhea, heartburn, melena, nausea and vomiting.  Genitourinary: Negative.   Musculoskeletal: Negative.  Negative for falls, joint pain and myalgias.  Skin:  Negative for itching and rash.  Neurological: Negative.  Negative for dizziness, tingling, sensory change, weakness and headaches.  Endo/Heme/Allergies: Negative.  Negative for polydipsia.  Psychiatric/Behavioral: Negative.  Negative for depression, memory loss, substance abuse and suicidal ideas. The patient is not nervous/anxious and does not have insomnia.   All other systems reviewed and are negative.  Physical Exam: Estimated body mass index is 32.85 kg/m as calculated from the following:   Height as of this encounter: '5\' 4"'$  (1.626 m).   Weight as of this encounter: 191 lb 6.4 oz (86.8 kg). BP 122/74   Pulse 62   Temp (!) 97.2 F (36.2 C)   Ht '5\' 4"'$  (1.626 m)   Wt 191 lb 6.4 oz (86.8 kg)   SpO2 98%   BMI 32.85 kg/m  General Appearance: Well nourished, in no apparent distress.  Eyes: PERRLA, EOMs, conjunctiva no swelling or erythema Sinuses: No Frontal/maxillary tenderness  ENT/Mouth: Ext aud canals clear, normal light reflex with TMs without erythema, bulging. Good dentition. No erythema, swelling, or exudate on post pharynx. Tonsils not swollen or erythematous. Hearing normal.  Neck: Supple, thyroid normal. No bruits  Respiratory: Respiratory effort normal, BS equal bilaterally without rales, rhonchi, wheezing or stridor.  Cardio: RRR without murmurs, rubs or gallops. Brisk peripheral pulses without edema.  Chest: symmetric, with normal excursions and percussion.  Breasts: defer GYN Abdomen: Soft, nontender, no guarding, rebound, hernias, masses, or  organomegaly.  Lymphatics: Non tender without lymphadenopathy.  Genitourinary: defer to GYN Musculoskeletal: Full ROM all peripheral extremities,5/5 strength, and normal gait.  Skin: Warm, dry without rashes, lesions, ecchymosis. Neuro: Cranial nerves intact, reflexes equal bilaterally. Normal muscle tone, no cerebellar symptoms. Sensation intact.  Psych: Awake and oriented X 3, normal affect, Insight and Judgment appropriate.   EKG: Sinus brady, WNL  Gorden Harms Genesis Paget 2:59 PM [facility]

## 2020-10-12 LAB — CBC WITH DIFFERENTIAL/PLATELET
Absolute Monocytes: 564 cells/uL (ref 200–950)
Basophils Absolute: 48 cells/uL (ref 0–200)
Basophils Relative: 0.8 %
Eosinophils Absolute: 48 cells/uL (ref 15–500)
Eosinophils Relative: 0.8 %
HCT: 39.8 % (ref 35.0–45.0)
Hemoglobin: 13.3 g/dL (ref 11.7–15.5)
Lymphs Abs: 2220 cells/uL (ref 850–3900)
MCH: 30.9 pg (ref 27.0–33.0)
MCHC: 33.4 g/dL (ref 32.0–36.0)
MCV: 92.6 fL (ref 80.0–100.0)
MPV: 11 fL (ref 7.5–12.5)
Monocytes Relative: 9.4 %
Neutro Abs: 3120 cells/uL (ref 1500–7800)
Neutrophils Relative %: 52 %
Platelets: 239 10*3/uL (ref 140–400)
RBC: 4.3 10*6/uL (ref 3.80–5.10)
RDW: 12.5 % (ref 11.0–15.0)
Total Lymphocyte: 37 %
WBC: 6 10*3/uL (ref 3.8–10.8)

## 2020-10-12 LAB — COMPLETE METABOLIC PANEL WITH GFR
AG Ratio: 1.5 (calc) (ref 1.0–2.5)
ALT: 13 U/L (ref 6–29)
AST: 17 U/L (ref 10–35)
Albumin: 4.4 g/dL (ref 3.6–5.1)
Alkaline phosphatase (APISO): 47 U/L (ref 37–153)
BUN: 19 mg/dL (ref 7–25)
CO2: 28 mmol/L (ref 20–32)
Calcium: 10.2 mg/dL (ref 8.6–10.4)
Chloride: 106 mmol/L (ref 98–110)
Creat: 0.94 mg/dL (ref 0.50–1.05)
Globulin: 3 g/dL (calc) (ref 1.9–3.7)
Glucose, Bld: 91 mg/dL (ref 65–99)
Potassium: 4.9 mmol/L (ref 3.5–5.3)
Sodium: 141 mmol/L (ref 135–146)
Total Bilirubin: 0.3 mg/dL (ref 0.2–1.2)
Total Protein: 7.4 g/dL (ref 6.1–8.1)
eGFR: 69 mL/min/{1.73_m2} (ref >=60)

## 2020-10-12 LAB — VITAMIN D 25 HYDROXY (VIT D DEFICIENCY, FRACTURES): Vit D, 25-Hydroxy: 90 ng/mL (ref 30–100)

## 2020-10-12 LAB — URINALYSIS, ROUTINE W REFLEX MICROSCOPIC
Bilirubin Urine: NEGATIVE
Glucose, UA: NEGATIVE
Hgb urine dipstick: NEGATIVE
Ketones, ur: NEGATIVE
Leukocytes,Ua: NEGATIVE
Nitrite: NEGATIVE
Protein, ur: NEGATIVE
Specific Gravity, Urine: 1.005 (ref 1.001–1.035)
pH: 6.5 (ref 5.0–8.0)

## 2020-10-12 LAB — MICROALBUMIN / CREATININE URINE RATIO
Creatinine, Urine: 22 mg/dL (ref 20–275)
Microalb, Ur: <0.2 mg/dL

## 2020-10-12 LAB — HEMOGLOBIN A1C
Hgb A1c MFr Bld: 5.8 % of total Hgb — ABNORMAL HIGH (ref <5.7)
Mean Plasma Glucose: 120 mg/dL
eAG (mmol/L): 6.6 mmol/L

## 2020-12-21 ENCOUNTER — Encounter: Payer: Self-pay | Admitting: Internal Medicine

## 2020-12-21 ENCOUNTER — Other Ambulatory Visit: Payer: Self-pay

## 2020-12-21 ENCOUNTER — Ambulatory Visit: Payer: BC Managed Care – PPO | Admitting: Internal Medicine

## 2020-12-21 VITALS — BP 145/92 | HR 99 | Temp 97.8°F | Resp 16 | Ht 64.0 in | Wt 199.0 lb

## 2020-12-21 DIAGNOSIS — S6391XA Sprain of unspecified part of right wrist and hand, initial encounter: Secondary | ICD-10-CM | POA: Diagnosis not present

## 2020-12-21 MED ORDER — DEXAMETHASONE 4 MG PO TABS
ORAL_TABLET | ORAL | 0 refills | Status: DC
Start: 2020-12-21 — End: 2022-10-15
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
## 2020-12-23 ENCOUNTER — Encounter: Payer: Self-pay | Admitting: Internal Medicine

## 2020-12-23 NOTE — Patient Instructions (Signed)
Wrist Sprain, Adult A wrist sprain is a stretch or tear in the strong tissues that connect the wrist bones to each other. These strong tissues are called ligaments. There are three types of wrist sprains: Grade 1. The ligament is stretched more than normal. There may be a minor amount of wrist pain. Grade 2. The ligament is partially torn. You may be able to move your wrist, but not very much. There may be a moderate amount of wrist pain. Grade 3. The ligament or ligaments are completely torn. You may find it difficult to move your wrist even a little. There may be a significant amount of wrist pain. What are the causes? This condition may be caused by using the wrist too much during sports,exercise, or work. It can also happen due to a fall or during an accident. What increases the risk? You are more likely to develop this condition if: You had a previous wrist or arm injury. You have poor wrist strength and flexibility. You play contact sports, such as football or soccer. You participate in sports that may result in a fall, such as skateboarding, biking, skiing, or snowboarding. You do not exercise regularly. You use exercise equipment that does not fit well. What are the signs or symptoms? Symptoms of this condition include: Pain in the wrist, arm, or hand. Swelling or bruised skin near the wrist, hand, or arm. The skin may look yellow or blue. Stiffness or trouble moving the hand. Hearing a noise, like a pop or a snap, at the time of injury, or feeling a tear at the time of the injury. A warm feeling in the skin around the wrist. How is this diagnosed? This condition is diagnosed with a physical exam. Sometimes an X-ray is taken to make sure a bone did not break. You may also have an MRI of your wrist tocheck for torn ligaments. How is this treated? This condition is treated by resting and applying ice to your wrist. Additional treatment may include: Taking medicine for pain and  inflammation. Wearing a splint, brace, or cast for a short period of time to keep your wrist from moving (immobilized). Doing exercises to strengthen and stretch your wrist. Having surgery. This may be done if the ligament is completely torn. Follow these instructions at home: If you have a splint or brace: Wear the splint or brace as told by your health care provider. Remove it only as told by your health care provider. Loosen it if your fingers tingle, become numb, or turn cold and blue. Keep it clean. If the splint or brace is not waterproof: Do not let it get wet. Cover it with a watertight covering when you take a bath or a shower. If you have a cast: Do not put pressure on any part of the cast until it is fully hardened. This may take several hours. Do not stick anything inside the cast to scratch your skin. Doing that increases your risk of infection. Check the skin around the cast every day. Tell your health care provider about any concerns. You may put lotion on dry skin around the edges of the cast. Do not put lotion on the skin underneath the cast. Keep it clean. If the cast is not waterproof: Do not let it get wet. Cover it with a watertight covering when you take a bath or shower. Managing pain, stiffness, and swelling  If directed, put ice on the injured area. To do this: If you have a removable splint   or brace, remove it as told by your health care provider. Put ice in a plastic bag. Place a towel between your skin and the bag or between the splint or cast and the bag. Leave the ice on for 20 minutes, 2-3 times a day. Remove the ice if your skin turns bright red. This is very important. If you cannot feel pain, heat, or cold, you have a greater risk of damage to the area. Move your fingers often to reduce stiffness and swelling. Raise (elevate) the injured area above the level of your heart while you are sitting or lying down.  Activity Rest your wrist as told by your  health care provider. Do not do things that cause pain. Ask your health care provider when it is safe to drive if you have a splint, brace, or cast on your wrist. Do exercises as told by your health care provider. Return to your normal activities as told by your health care provider. Ask your health care provider what activities are safe for you. General instructions Take over-the-counter and prescription medicines only as told by your health care provider. Do not use any products that contain nicotine or tobacco, such as cigarettes, e-cigarettes, and chewing tobacco. These can delay healing. If you need help quitting, ask your health care provider. Keep all follow-up visits. This is important. Contact a health care provider if: Your pain, bruising, or swelling gets worse. Your skin becomes red, gets a rash, or has open sores. Your pain does not get better or it gets worse. Get help right away if: You have a new or sudden sharp pain in the hand, arm, or wrist. You have tingling or numbness in your hand. Your fingers turn white, very red, or cold and blue. You cannot move your fingers. Summary A wrist sprain is damage to ligaments in your wrist. Wrist sprains can range from mild to severe. Return to your normal activities as told by your health care provider. Ask your health care provider what activities are safe for you. You may need to wear a splint, brace, or cast for a short period of time. This information is not intended to replace advice given to you by your health care provider. Make sure you discuss any questions you have with your healthcare provider. Document Revised: 07/05/2019 Document Reviewed: 07/05/2019 Elsevier Patient Education  2022 Elsevier Inc.  

## 2020-12-29 ENCOUNTER — Other Ambulatory Visit: Payer: Self-pay | Admitting: Internal Medicine

## 2020-12-29 MED ORDER — MELOXICAM 15 MG PO TABS: ORAL_TABLET | ORAL | 0 refills | Status: DC

## 2021-03-06 ENCOUNTER — Encounter: Payer: Self-pay | Admitting: Adult Health

## 2021-04-19 ENCOUNTER — Ambulatory Visit: Payer: BC Managed Care – PPO | Admitting: Adult Health

## 2021-07-20 ENCOUNTER — Other Ambulatory Visit: Payer: Self-pay | Admitting: Internal Medicine

## 2021-07-20 DIAGNOSIS — Z1231 Encounter for screening mammogram for malignant neoplasm of breast: Secondary | ICD-10-CM

## 2021-09-08 LAB — HM MAMMOGRAPHY

## 2021-09-18 ENCOUNTER — Ambulatory Visit: Payer: BC Managed Care – PPO

## 2021-09-20 ENCOUNTER — Ambulatory Visit
Admission: RE | Admit: 2021-09-20 | Discharge: 2021-09-20 | Disposition: A | Discharge status: Home / Self Care | Payer: BC Managed Care – PPO | Source: Ambulatory Visit | Attending: Internal Medicine | Admitting: Internal Medicine

## 2021-09-20 DIAGNOSIS — Z1231 Encounter for screening mammogram for malignant neoplasm of breast: Secondary | ICD-10-CM

## 2021-10-11 ENCOUNTER — Ambulatory Visit (INDEPENDENT_AMBULATORY_CARE_PROVIDER_SITE_OTHER): Payer: BC Managed Care – PPO | Admitting: Nurse Practitioner

## 2021-10-11 ENCOUNTER — Encounter: Payer: Self-pay | Admitting: Nurse Practitioner

## 2021-10-11 VITALS — BP 120/82 | HR 62 | Temp 97.9°F | Ht 63.5 in | Wt 202.0 lb

## 2021-10-11 DIAGNOSIS — Z1389 Encounter for screening for other disorder: Secondary | ICD-10-CM

## 2021-10-11 DIAGNOSIS — E559 Vitamin D deficiency, unspecified: Secondary | ICD-10-CM | POA: Diagnosis not present

## 2021-10-11 DIAGNOSIS — E669 Obesity, unspecified: Secondary | ICD-10-CM

## 2021-10-11 DIAGNOSIS — Z0001 Encounter for general adult medical examination with abnormal findings: Secondary | ICD-10-CM

## 2021-10-11 DIAGNOSIS — Z13220 Encounter for screening for lipoid disorders: Secondary | ICD-10-CM | POA: Diagnosis not present

## 2021-10-11 DIAGNOSIS — Z131 Encounter for screening for diabetes mellitus: Secondary | ICD-10-CM | POA: Diagnosis not present

## 2021-10-11 DIAGNOSIS — Z79899 Other long term (current) drug therapy: Secondary | ICD-10-CM

## 2021-10-11 DIAGNOSIS — Z86010 Personal history of colonic polyps: Secondary | ICD-10-CM

## 2021-10-11 DIAGNOSIS — R7309 Other abnormal glucose: Secondary | ICD-10-CM

## 2021-10-11 DIAGNOSIS — I10 Essential (primary) hypertension: Secondary | ICD-10-CM

## 2021-10-11 DIAGNOSIS — E785 Hyperlipidemia, unspecified: Secondary | ICD-10-CM

## 2021-10-11 DIAGNOSIS — Z136 Encounter for screening for cardiovascular disorders: Secondary | ICD-10-CM

## 2021-10-11 DIAGNOSIS — L308 Other specified dermatitis: Secondary | ICD-10-CM

## 2021-10-11 DIAGNOSIS — Z0000 Encounter for general adult medical examination without abnormal findings: Secondary | ICD-10-CM | POA: Diagnosis not present

## 2021-10-11 DIAGNOSIS — Z1329 Encounter for screening for other suspected endocrine disorder: Secondary | ICD-10-CM

## 2021-10-11 MED ORDER — HALOBETASOL PROPIONATE 0.05 % EX OINT: TOPICAL_OINTMENT | Freq: Two times a day (BID) | CUTANEOUS | 0 refills | Status: DC

## 2021-10-11 NOTE — Patient Instructions (Signed)
Eczema Eczema refers to a group of skin conditions that cause skin to become rough and inflamed. Each type of eczema has different triggers, symptoms, and treatments. Eczema of any type is usually itchy. Symptoms range from mild to severe. Eczema is not spread from person to person (is not contagious). It can appear on different parts of the body at different times. One person's eczema may look different from another person's eczema. What are the causes? The exact cause of this condition is not known. However, exposure to certain environmental factors, irritants, and allergens can make the condition worse. What are the signs or symptoms? Symptoms of this condition depend on the type of eczema you have. The types include: Contact dermatitis. There are two kinds: Irritant contact dermatitis. This happens when something irritates the skin and causes a rash. Allergic contact dermatitis. This happens when your skin comes in contact with something you are allergic to (allergens). This can include poison ivy, chemicals, or medicines that were applied to your skin. Atopic dermatitis. This is a long-term (chronic) skin disease that keeps coming back (recurring). It is the most common type of eczema. Usual symptoms are a red rash and itchy, dry, scaly skin. It usually starts showing signs in infancy and can last through adulthood. Dyshidrotic eczema. This is a form of eczema on the hands and feet. It shows up as very itchy, fluid-filled blisters. It can affect people of any age but is more common before age 40. Hand eczema. This causes very itchy areas of skin on the palms and sides of the hands and fingers. This type of eczema is common in industrial jobs where you may be exposed to different types of irritants. Lichen simplex chronicus. This type of eczema occurs when a person constantly scratches one area of the body. Repeated scratching of the area leads to thickened skin (lichenification). This condition can  accompany other types of eczema. It is more common in adults but may also be seen in children. Nummular eczema. This is a common type of eczema that most often affects the lower legs and the backs of the hands. It typically causes an itchy, red, circular, crusty lesion (plaque). Scratching may become a habit and can cause bleeding. Nummular eczema occurs most often in middle-aged or older people. Seborrheic dermatitis. This is a common skin disease that mainly affects the scalp. It may also affect other oily areas of the body, such as the face, sides of the nose, eyebrows, ears, eyelids, and chest. It is marked by small scaling and redness of the skin (erythema). This can affect people of all ages. In infants, this condition is called cradle cap. Stasis dermatitis. This is a common skin disease that can cause itching, scaling, and hyperpigmentation, usually on the legs and feet. It occurs most often in people who have a condition that prevents blood from being pumped through the veins in the legs (chronic venous insufficiency). Stasis dermatitis is a chronic condition that needs long-term management. How is this diagnosed? This condition may be diagnosed based on: A physical exam of your skin. Your medical history. Skin patch tests. These tests involve using patches that contain possible allergens and placing them on your back. Your health care provider will check in a few days to see if an allergic reaction occurred. How is this treated? Treatment for eczema is based on the type of eczema you have. You may be given hydrocortisone steroid medicine or antihistamines. These can relieve itching quickly and help reduce inflammation.   These may be prescribed or purchased over the counter, depending on the strength that is needed. Follow these instructions at home: Take or apply over-the-counter and prescription medicines only as told by your health care provider. Use creams or ointments to moisturize your  skin. Do not use lotions. Learn what triggers or irritates your symptoms so you can avoid these things. Treat symptom flare-ups quickly. Do not scratch your skin. This can make your rash worse. Keep all follow-up visits. This is important. Where to find more information American Academy of Dermatology: aad.org National Eczema Association: nationaleczema.org The Society for Pediatric Dermatology: pedsderm.net Contact a health care provider if: You have severe itching, even with treatment. You scratch your skin regularly until it bleeds. Your rash looks different than usual. Your skin is painful, swollen, or more red than usual. You have a fever. Summary Eczema refers to a group of skin conditions that cause skin to become rough and inflamed. Each type has different triggers. Eczema of any type causes itching that may range from mild to severe. Treatment varies based on the type of eczema you have. Hydrocortisone steroid medicine or antihistamines can help with itching and inflammation. Protecting your skin is the best way to prevent eczema. Use creams or ointments to moisturize your skin. Avoid triggers and irritants. Treat flare-ups quickly. This information is not intended to replace advice given to you by your health care provider. Make sure you discuss any questions you have with your health care provider. Document Revised: 12/06/2019 Document Reviewed: 12/06/2019 Elsevier Patient Education  2023 Elsevier Inc.  

## 2021-10-11 NOTE — Progress Notes (Signed)
Complete Physical  Assessment and Plan:  Encounter for Annual Physical Exam with abnormal findings Due annually  Health Maintenance reviewed Healthy lifestyle reviewed and goals set  Essential hypertension Controlled  Discussed DASH (Dietary Approaches to Stop Hypertension) DASH diet is lower in sodium than a typical American diet. Cut back on foods that are high in saturated fat, cholesterol, and trans fats. Eat more whole-grain foods, fish, poultry, and nuts Remain active and exercise as tolerated daily.  Monitor BP at home-Call if greater than 130/80.  Check CBC/CMP/GFR   Hyperlipidemia, unspecified hyperlipidemia type Discussed lifestyle modifications. Recommended diet heavy in fruits and veggies, omega 3's. Decrease consumption of animal meats, cheeses, and dairy products. Remain active and exercise as tolerated. Continue to monitor. Check lipids.  PreDM Education: Reviewed 'ABCs' of diabetes management  Work towards achieving/maintaining goal: A1C (<7) Blood pressure (<130/80) Cholesterol (LDL <70) Continue Eye Exam yearly  Continue Dental Exam Q6 mo Discussed dietary recommendations Discussed Physical Activity recommendations Foot exam UTD Check A1C  Obesity -BMI 21 Long discussion about weight loss, diet, and exercise Recommended diet heavy in fruits and veggies and low in animal meats, cheeses, and dairy products, appropriate calorie intake Discussed appropriate weight for height  Continue excellent progress with exercise, diet/portions Follow up at next visit  Vitamin D deficiency Discussed importance of supplement. Continue to monitor  Medication management All medications discussed and reviewed in full. All questions and concerns regarding medications addressed.     History of adenomatous polyp of colon - 2024 Insurance will not pay prior to 10 years.  Due 2024 Unable to afford to self pay Last report reviewed; no precancerous changes on  pathology Discussed alternative options, FIT testing; declines at this time  Screening for Cardiovascular Condition Perform EKG  Screening for Hematuria Proteinuria Check UA  Screening for Thyroid Disorder Check TSH  Other Eczema Halobetasol refilled.  Orders Placed This Encounter  Procedures   CBC with Differential/Platelet   COMPLETE METABOLIC PANEL WITH GFR   Magnesium   Lipid panel   TSH   Hemoglobin A1c   VITAMIN D 25 Hydroxy (Vit-D Deficiency, Fractures)   Urinalysis, Routine w reflex microscopic   Meds ordered this encounter  Medications   halobetasol (ULTRAVATE) 0.05 % ointment    Sig: Apply topically 2 (two) times daily. To eczema plaques. Do no use on face/hands/underarms.    Dispense:  50 g    Refill:  0    Order Specific Question:   Supervising Provider    Answer:   Unk Pinto [phone]    Discussed med's effects and SE's. Screening labs and tests as requested with regular follow-up as recommended.  Over 30 minutes of exam, counseling, chart review, and complex, high level critical decision making was performed this visit.     Future Appointments  Date Time Provider [place]  10/15/2022  3:00 PM Darrol Jump, NP [facility] None     HPI  61 y.o. female  presents for a complete physical and follow up for has Hypertension; Obesity (BMI 30.0-34.9); History of adenomatous polyp of colon; Other abnormal glucose (prediabetes); Vitamin D deficiency; and Dry eyes on their problem list.   Heather Walton reports to clinic today for annual physical.  Overall she reports feeling well.    Single, no children. She sees GYN annually, Dr. Willis Modena, typically in the fall.   She works as Therapist, art by phone, from home. Enjoys her job.   Noted reduced eye dryness and burning over covid and requested to  continue remote work but slowly phasing back. Follows with ophth provider for dry eyes. She feels as though this was related to the building at  work.  This has now improved.    Mom has Alzheimer's with sig decline in the last few years She takes turns with her sisters staying with her and helping.   BMI is Body mass index is 35.22 kg/m., she has been working on diet and exercise, has become more involved with her father's care tending to him and not working out.  Wt Readings from Last 3 Encounters:  10/11/21 202 lb (91.6 kg)  12/21/20 199 lb (90.3 kg)  10/11/20 191 lb 6.4 oz (86.8 kg)   Her blood pressure has been controlled at home, today their BP is BP: 120/82 She does workout, walking a lot. She denies chest pain, shortness of breath, dizziness.   She is not on cholesterol medication and denies myalgias. Her cholesterol is at goal, on review, consistently at goal last 5 checks. The cholesterol last visit was:   Lab Results  Component Value Date   CHOL 140 10/06/2019   HDL 59 10/06/2019   LDLCALC 66 10/06/2019   TRIG 70 10/06/2019   CHOLHDL 2.4 10/06/2019   She has been working on diet and exercise for prediabetes,  and denies paresthesia of the feet, polydipsia, polyuria and visual disturbances.  Last A1C in the office was:  Lab Results  Component Value Date   HGBA1C 5.8 (H) 10/11/2020   Lab Results  Component Value Date   GFRAA 86 04/20/2020   Patient is on Vitamin D supplement, 5000 IU, takes 10000 IU 2-3 days a week Lab Results  Component Value Date   VD25OH 90 10/11/2020       Current Medications:  Current Outpatient Medications on File Prior to Visit  Medication Sig Dispense Refill   Ascorbic Acid (VITAMIN C) 1000 MG tablet Take 1,000 mg by mouth daily.     Cholecalciferol (VITAMIN D-3) 5000 UNITS TABS Take 5,000 Units by mouth daily.     cycloSPORINE, PF, (CEQUA) 0.09 % SOLN Apply to eye.     halobetasol (ULTRAVATE) 0.05 % ointment Apply topically 2 (two) times daily. To eczema plaques. Do no use on face/hands/underarms. 50 g 0   Magnesium Chloride (MAGNESIUM DR PO) Take 250 mg by mouth daily.       meloxicam (MOBIC) 15 MG tablet Take 1/2 to 1 tablet Daily with Food for Pain & Inflammation 90 tablet 0   Multiple Vitamin (MULTIVITAMIN) tablet Take 1 tablet by mouth daily.     vitamin E 400 UNIT capsule Take 400 Units by mouth daily.     clotrimazole-betamethasone (LOTRISONE) cream Apply 1 application topically 2 (two) times daily. (Patient not taking: Reported on 10/11/2021) 15 g 2   dexamethasone (DECADRON) 4 MG tablet Take 1 tab 3 x day - 3 days, then 2 x day - 3 days, then 1 tab daily 20 tablet 0   Lifitegrast 5 % SOLN Apply to eye 2 (two) times daily. (Patient not taking: Reported on 10/11/2021)     No current facility-administered medications on file prior to visit.   Allergies:  Allergies  Allergen Reactions   Levaquin [Levofloxacin In D5w] Nausea Only   Maxitrol [Neomycin-Polymyxin-Dexameth]     Distorted patient's vision for over a month   Phentermine    Prilosec [Omeprazole] Rash   Medical History:  She has Hypertension; Obesity (BMI 30.0-34.9); History of adenomatous polyp of colon; Other abnormal glucose (prediabetes); Vitamin  D deficiency; and Dry eyes on their problem list.   Health Maintenance:   Immunization History  Administered Date(s) Administered   Influenza-Unspecified 12/07/2013, 01/06/2015, 11/25/2015, 12/08/2017   PFIZER(Purple Top)SARS-COV-2 Vaccination 06/11/2019, 09/08/2019, 01/29/2020, 06/16/2020   PPD Test 08/19/2013   Td 10/06/2019   Tdap 01/12/2010   Zoster Recombinat (Shingrix) 10/02/2019, 12/08/2019   Tetanus: 2021 Pneumonia: N/A Prevnar due age 20 Shingles vaccine: 2/2 shingrix -  Flu vaccine: 2022 get annually  Covid 19: 2/2, 2021, pfizer  Pap: Has annually at [facility], has scheduled fall 2023 MGM: 09/14/2021 Colonoscopy: 10/2012, 1 adenomatous polyp without high grade dysplasia, Dr. Henrene Pastor, due follow up 10/2017 - patient attempted to schedule but was told insurance would not pay until 10 years Due 2024  Last Eye Exam: [facility], 2023,  contacts/glasses Last dental exam: Dental works on [employer], goes q30m overdue   Last derm: 2019  Patient Care Team: MUnk Pinto MD as PCP - General (Internal Medicine) YDeneise Lever MD as Consulting Physician (Pulmonary Disease) PIrene Shipper MD as Consulting Physician (Gastroenterology) TLavonna Monarch MD as Consulting Physician (Dermatology)  Surgical History:  She has a past surgical history that includes Diagnostic laparoscopy (1998). Family History:  Herfamily history includes Alzheimer's disease (age of onset: 722 in her mother; Breast cancer in her cousin; Diabetes in her maternal grandfather, maternal grandmother, paternal grandfather, and paternal grandmother; Hypertension in her mother; Lung cancer (age of onset: 587 in her father. Social History:  She reports that she has never smoked. She has never used smokeless tobacco. She reports that she does not drink alcohol and does not use drugs.  Review of Systems: Review of Systems  Constitutional: Negative.  Negative for malaise/fatigue and weight loss.  HENT: Negative.  Negative for hearing loss and tinnitus.   Eyes: Negative.  Negative for blurred vision and double vision.  Respiratory: Negative.  Negative for cough, sputum production, shortness of breath and wheezing.   Cardiovascular: Negative.  Negative for chest pain, palpitations, orthopnea, claudication, leg swelling and PND.  Gastrointestinal: Negative.  Negative for abdominal pain, blood in stool, constipation, diarrhea, heartburn, melena, nausea and vomiting.  Genitourinary: Negative.   Musculoskeletal: Negative.  Negative for falls, joint pain and myalgias.  Skin:  Negative for itching and rash.  Neurological: Negative.  Negative for dizziness, tingling, sensory change, weakness and headaches.  Endo/Heme/Allergies: Negative.  Negative for polydipsia.  Psychiatric/Behavioral: Negative.  Negative for depression, memory loss, substance abuse and suicidal  ideas. The patient is not nervous/anxious and does not have insomnia.   All other systems reviewed and are negative.   Physical Exam: Estimated body mass index is 35.22 kg/m as calculated from the following:   Height as of this encounter: 5' 3.5" (1.613 m).   Weight as of this encounter: 202 lb (91.6 kg). BP 120/82   Pulse 62   Temp 97.9 F (36.6 C)   Ht 5' 3.5" (1.613 m)   Wt 202 lb (91.6 kg)   SpO2 98%   BMI 35.22 kg/m  General Appearance: Well nourished, in no apparent distress.  Eyes: PERRLA, EOMs, conjunctiva no swelling or erythema Sinuses: No Frontal/maxillary tenderness  ENT/Mouth: Ext aud canals clear, normal light reflex with TMs without erythema, bulging. Good dentition. No erythema, swelling, or exudate on post pharynx. Tonsils not swollen or erythematous. Hearing normal.  Neck: Supple, thyroid normal. No bruits  Respiratory: Respiratory effort normal, BS equal bilaterally without rales, rhonchi, wheezing or stridor.  Cardio: RRR without murmurs, rubs or gallops.  Brisk peripheral pulses without edema.  Chest: symmetric, with normal excursions and percussion.  Breasts: defer GYN Abdomen: Soft, nontender, no guarding, rebound, hernias, masses, or organomegaly.  Lymphatics: Non tender without lymphadenopathy.  Genitourinary: defer to GYN Musculoskeletal: Full ROM all peripheral extremities,5/5 strength, and normal gait.  Skin: Warm, dry without rashes, lesions, ecchymosis. Neuro: Cranial nerves intact, reflexes equal bilaterally. Normal muscle tone, no cerebellar symptoms. Sensation intact.  Psych: Awake and oriented X 3, normal affect, Insight and Judgment appropriate.   EKG: 10/2020 Sinus brady, WNL - defer every other year.  Sham Alviar 3:21 PM [facility]

## 2021-10-12 LAB — COMPLETE METABOLIC PANEL WITH GFR
AG Ratio: 1.6 (calc) (ref 1.0–2.5)
ALT: 15 U/L (ref 6–29)
AST: 16 U/L (ref 10–35)
Albumin: 4.5 g/dL (ref 3.6–5.1)
Alkaline phosphatase (APISO): 55 U/L (ref 37–153)
BUN: 16 mg/dL (ref 7–25)
CO2: 26 mmol/L (ref 20–32)
Calcium: 9.6 mg/dL (ref 8.6–10.4)
Chloride: 105 mmol/L (ref 98–110)
Creat: 0.86 mg/dL (ref 0.50–1.05)
Globulin: 2.9 g/dL (calc) (ref 1.9–3.7)
Glucose, Bld: 101 mg/dL — ABNORMAL HIGH (ref 65–99)
Potassium: 4.3 mmol/L (ref 3.5–5.3)
Sodium: 140 mmol/L (ref 135–146)
Total Bilirubin: 0.3 mg/dL (ref 0.2–1.2)
Total Protein: 7.4 g/dL (ref 6.1–8.1)
eGFR: 77 mL/min/{1.73_m2} (ref >=60)

## 2021-10-12 LAB — LIPID PANEL
Cholesterol: 168 mg/dL (ref <200)
HDL: 61 mg/dL (ref >=50)
LDL Cholesterol (Calc): 81 mg/dL (calc)
Non-HDL Cholesterol (Calc): 107 mg/dL (calc) (ref <130)
Total CHOL/HDL Ratio: 2.8 (calc) (ref <5.0)
Triglycerides: 162 mg/dL — ABNORMAL HIGH (ref <150)

## 2021-10-12 LAB — VITAMIN D 25 HYDROXY (VIT D DEFICIENCY, FRACTURES): Vit D, 25-Hydroxy: 48 ng/mL (ref 30–100)

## 2021-10-12 LAB — CBC WITH DIFFERENTIAL/PLATELET
Absolute Monocytes: 527 cells/uL (ref 200–950)
Basophils Absolute: 62 cells/uL (ref 0–200)
Basophils Relative: 1 %
Eosinophils Absolute: 50 cells/uL (ref 15–500)
Eosinophils Relative: 0.8 %
HCT: 41 % (ref 35.0–45.0)
Hemoglobin: 14.2 g/dL (ref 11.7–15.5)
Lymphs Abs: 2709 cells/uL (ref 850–3900)
MCH: 31.1 pg (ref 27.0–33.0)
MCHC: 34.6 g/dL (ref 32.0–36.0)
MCV: 89.9 fL (ref 80.0–100.0)
MPV: 11 fL (ref 7.5–12.5)
Monocytes Relative: 8.5 %
Neutro Abs: 2852 cells/uL (ref 1500–7800)
Neutrophils Relative %: 46 %
Platelets: 212 10*3/uL (ref 140–400)
RBC: 4.56 10*6/uL (ref 3.80–5.10)
RDW: 12.5 % (ref 11.0–15.0)
Total Lymphocyte: 43.7 %
WBC: 6.2 10*3/uL (ref 3.8–10.8)

## 2021-10-12 LAB — MAGNESIUM: Magnesium: 2.3 mg/dL (ref 1.5–2.5)

## 2021-10-12 LAB — URINALYSIS, ROUTINE W REFLEX MICROSCOPIC
Bilirubin Urine: NEGATIVE
Glucose, UA: NEGATIVE
Hgb urine dipstick: NEGATIVE
Ketones, ur: NEGATIVE
Leukocytes,Ua: NEGATIVE
Nitrite: NEGATIVE
Protein, ur: NEGATIVE
Specific Gravity, Urine: 1.005 (ref 1.001–1.035)
pH: 7 (ref 5.0–8.0)

## 2021-10-12 LAB — HEMOGLOBIN A1C
Hgb A1c MFr Bld: 6 % of total Hgb — ABNORMAL HIGH (ref <5.7)
Mean Plasma Glucose: 126 mg/dL
eAG (mmol/L): 7 mmol/L

## 2021-10-12 LAB — TSH: TSH: 0.96 mIU/L (ref 0.40–4.50)

## 2021-11-14 DIAGNOSIS — Z1389 Encounter for screening for other disorder: Secondary | ICD-10-CM | POA: Diagnosis not present

## 2021-11-14 DIAGNOSIS — Z01419 Encounter for gynecological examination (general) (routine) without abnormal findings: Secondary | ICD-10-CM | POA: Diagnosis not present

## 2021-11-14 DIAGNOSIS — Z130 Encounter for screening for diseases of the blood and blood-forming organs and certain disorders involving the immune mechanism: Secondary | ICD-10-CM | POA: Diagnosis not present

## 2021-11-14 DIAGNOSIS — Z1151 Encounter for screening for human papillomavirus (HPV): Secondary | ICD-10-CM | POA: Diagnosis not present

## 2021-11-14 DIAGNOSIS — Z124 Encounter for screening for malignant neoplasm of cervix: Secondary | ICD-10-CM | POA: Diagnosis not present

## 2021-11-14 DIAGNOSIS — Z6835 Body mass index (BMI) 35.0-35.9, adult: Secondary | ICD-10-CM | POA: Diagnosis not present

## 2022-01-02 ENCOUNTER — Encounter: Payer: Self-pay | Admitting: Internal Medicine

## 2022-01-10 ENCOUNTER — Encounter: Payer: Self-pay | Admitting: Internal Medicine

## 2022-08-15 ENCOUNTER — Other Ambulatory Visit: Payer: Self-pay | Admitting: Internal Medicine

## 2022-08-15 DIAGNOSIS — Z1231 Encounter for screening mammogram for malignant neoplasm of breast: Secondary | ICD-10-CM

## 2022-08-29 DIAGNOSIS — L249 Irritant contact dermatitis, unspecified cause: Secondary | ICD-10-CM | POA: Diagnosis not present

## 2022-08-29 DIAGNOSIS — L810 Postinflammatory hyperpigmentation: Secondary | ICD-10-CM | POA: Diagnosis not present

## 2022-09-24 ENCOUNTER — Ambulatory Visit: Payer: Self-pay

## 2022-09-24 ENCOUNTER — Ambulatory Visit: Payer: BC Managed Care – PPO

## 2022-09-26 ENCOUNTER — Encounter: Payer: Self-pay | Admitting: Internal Medicine

## 2022-10-03 ENCOUNTER — Ambulatory Visit
Admission: RE | Admit: 2022-10-03 | Discharge: 2022-10-03 | Disposition: A | Discharge status: Home / Self Care | Payer: BC Managed Care – PPO | Source: Ambulatory Visit | Attending: Internal Medicine | Admitting: Internal Medicine

## 2022-10-03 DIAGNOSIS — Z1231 Encounter for screening mammogram for malignant neoplasm of breast: Secondary | ICD-10-CM

## 2022-10-15 ENCOUNTER — Ambulatory Visit (INDEPENDENT_AMBULATORY_CARE_PROVIDER_SITE_OTHER): Payer: BC Managed Care – PPO | Admitting: Nurse Practitioner

## 2022-10-15 ENCOUNTER — Encounter: Payer: Self-pay | Admitting: Nurse Practitioner

## 2022-10-15 VITALS — BP 102/78 | HR 59 | Temp 97.6°F | Ht 63.0 in | Wt 209.0 lb

## 2022-10-15 DIAGNOSIS — R7309 Other abnormal glucose: Secondary | ICD-10-CM

## 2022-10-15 DIAGNOSIS — E785 Hyperlipidemia, unspecified: Secondary | ICD-10-CM

## 2022-10-15 DIAGNOSIS — Z0000 Encounter for general adult medical examination without abnormal findings: Secondary | ICD-10-CM

## 2022-10-15 DIAGNOSIS — Z1389 Encounter for screening for other disorder: Secondary | ICD-10-CM

## 2022-10-15 DIAGNOSIS — Z86010 Personal history of colonic polyps: Secondary | ICD-10-CM

## 2022-10-15 DIAGNOSIS — Z130 Encounter for screening for diseases of the blood and blood-forming organs and certain disorders involving the immune mechanism: Secondary | ICD-10-CM

## 2022-10-15 DIAGNOSIS — Z79899 Other long term (current) drug therapy: Secondary | ICD-10-CM

## 2022-10-15 DIAGNOSIS — L308 Other specified dermatitis: Secondary | ICD-10-CM

## 2022-10-15 DIAGNOSIS — Z136 Encounter for screening for cardiovascular disorders: Secondary | ICD-10-CM

## 2022-10-15 DIAGNOSIS — E559 Vitamin D deficiency, unspecified: Secondary | ICD-10-CM

## 2022-10-15 DIAGNOSIS — Z131 Encounter for screening for diabetes mellitus: Secondary | ICD-10-CM | POA: Diagnosis not present

## 2022-10-15 DIAGNOSIS — Z13220 Encounter for screening for lipoid disorders: Secondary | ICD-10-CM

## 2022-10-15 DIAGNOSIS — E669 Obesity, unspecified: Secondary | ICD-10-CM

## 2022-10-15 DIAGNOSIS — Z1329 Encounter for screening for other suspected endocrine disorder: Secondary | ICD-10-CM

## 2022-10-15 DIAGNOSIS — I10 Essential (primary) hypertension: Secondary | ICD-10-CM | POA: Diagnosis not present

## 2022-10-15 DIAGNOSIS — Z0001 Encounter for general adult medical examination with abnormal findings: Secondary | ICD-10-CM

## 2022-10-15 LAB — CBC WITH DIFFERENTIAL/PLATELET
Absolute Monocytes: 528 cells/uL (ref 200–950)
Basophils Absolute: 59 cells/uL (ref 0–200)
Basophils Relative: 0.9 %
Eosinophils Absolute: 59 cells/uL (ref 15–500)
Eosinophils Relative: 0.9 %
HCT: 39.7 % (ref 35.0–45.0)
Hemoglobin: 13.4 g/dL (ref 11.7–15.5)
Lymphs Abs: 2541 cells/uL (ref 850–3900)
MCH: 30.2 pg (ref 27.0–33.0)
MCHC: 33.8 g/dL (ref 32.0–36.0)
MCV: 89.4 fL (ref 80.0–100.0)
MPV: 10.9 fL (ref 7.5–12.5)
Monocytes Relative: 8 %
Neutro Abs: 3412 cells/uL (ref 1500–7800)
Neutrophils Relative %: 51.7 %
Platelets: 258 10*3/uL (ref 140–400)
RBC: 4.44 10*6/uL (ref 3.80–5.10)
RDW: 12.7 % (ref 11.0–15.0)
Total Lymphocyte: 38.5 %
WBC: 6.6 10*3/uL (ref 3.8–10.8)

## 2022-10-15 NOTE — Patient Instructions (Addendum)
Cologuard for Colon Cancer   Healthy Eating, Adult Healthy eating may help you get and keep a healthy body weight, reduce the risk of chronic disease, and live a long and productive life. It is important to follow a healthy eating pattern. Your nutritional and calorie needs should be met mainly by different nutrient-rich foods. What are tips for following this plan? Reading food labels Read labels and choose the following: Reduced or low sodium products. Juices with 100% fruit juice. Foods with low saturated fats (<3 g per serving) and high polyunsaturated and monounsaturated fats. Foods with whole grains, such as whole wheat, cracked wheat, brown rice, and wild rice. Whole grains that are fortified with folic acid. This is recommended for females who are pregnant or who want to become pregnant. Read labels and do not eat or drink the following: Foods or drinks with added sugars. These include foods that contain brown sugar, corn sweetener, corn syrup, dextrose, fructose, glucose, high-fructose corn syrup, honey, invert sugar, lactose, malt syrup, maltose, molasses, raw sugar, sucrose, trehalose, or turbinado sugar. Limit your intake of added sugars to less than 10% of your total daily calories. Do not eat more than the following amounts of added sugar per day: 6 teaspoons (25 g) for females. 9 teaspoons (38 g) for males. Foods that contain processed or refined starches and grains. Refined grain products, such as white flour, degermed cornmeal, white bread, and white rice. Shopping Choose nutrient-rich snacks, such as vegetables, whole fruits, and nuts. Avoid high-calorie and high-sugar snacks, such as potato chips, fruit snacks, and candy. Use oil-based dressings and spreads on foods instead of solid fats such as butter, margarine, sour cream, or cream cheese. Limit pre-made sauces, mixes, and "instant" products such as flavored rice, instant noodles, and ready-made pasta. Try more  plant-protein sources, such as tofu, tempeh, black beans, edamame, lentils, nuts, and seeds. Explore eating plans such as the Mediterranean diet or vegetarian diet. Try heart-healthy dips made with beans and healthy fats like hummus and guacamole. Vegetables go great with these. Cooking Use oil to saut or stir-fry foods instead of solid fats such as butter, margarine, or lard. Try baking, boiling, grilling, or broiling instead of frying. Remove the fatty part of meats before cooking. Steam vegetables in water or broth. Meal planning  At meals, imagine dividing your plate into fourths: One-half of your plate is fruits and vegetables. One-fourth of your plate is whole grains. One-fourth of your plate is protein, especially lean meats, poultry, eggs, tofu, beans, or nuts. Include low-fat dairy as part of your daily diet. Lifestyle Choose healthy options in all settings, including home, work, school, restaurants, or stores. Prepare your food safely: Wash your hands after handling raw meats. Where you prepare food, keep surfaces clean by regularly washing with hot, soapy water. Keep raw meats separate from ready-to-eat foods, such as fruits and vegetables. Cook seafood, meat, poultry, and eggs to the recommended temperature. Get a food thermometer. Store foods at safe temperatures. In general: Keep cold foods at 68F (4.4C) or below. Keep hot foods at 168F (60C) or above. Keep your freezer at [facility] (-17.8C) or below. Foods are not safe to eat if they have been between the temperatures of 40-168F (4.4-60C) for more than 2 hours. What foods should I eat? Fruits Aim to eat 1-2 cups of fresh, canned (in natural juice), or frozen fruits each day. One cup of fruit equals 1 small apple, 1 large banana, 8 large strawberries, 1 cup (237 g) canned fruit,  cup (82 g) dried fruit, or 1 cup (240 mL) 100% juice. Vegetables Aim to eat 2-4 cups of fresh and frozen vegetables each day, including  different varieties and colors. One cup of vegetables equals 1 cup (91 g) broccoli or cauliflower florets, 2 medium carrots, 2 cups (150 g) raw, leafy greens, 1 large tomato, 1 large bell pepper, 1 large sweet potato, or 1 medium white potato. Grains Aim to eat 5-10 ounce-equivalents of whole grains each day. Examples of 1 ounce-equivalent of grains include 1 slice of bread, 1 cup (40 g) ready-to-eat cereal, 3 cups (24 g) popcorn, or  cup (93 g) cooked rice. Meats and other proteins Try to eat 5-7 ounce-equivalents of protein each day. Examples of 1 ounce-equivalent of protein include 1 egg,  oz nuts (12 almonds, 24 pistachios, or 7 walnut halves), 1/4 cup (90 g) cooked beans, 6 tablespoons (90 g) hummus or 1 tablespoon (16 g) peanut butter. A cut of meat or fish that is the size of a deck of cards is about 3-4 ounce-equivalents (85 g). Of the protein you eat each week, try to have at least 8 sounce (227 g) of seafood. This is about 2 servings per week. This includes salmon, trout, herring, sardines, and anchovies. Dairy Aim to eat 3 cup-equivalents of fat-free or low-fat dairy each day. Examples of 1 cup-equivalent of dairy include 1 cup (240 mL) milk, 8 ounces (250 g) yogurt, 1 ounces (44 g) natural cheese, or 1 cup (240 mL) fortified soy milk. Fats and oils Aim for about 5 teaspoons (21 g) of fats and oils per day. Choose monounsaturated fats, such as canola and olive oils, mayonnaise made with olive oil or avocado oil, avocados, peanut butter, and most nuts, or polyunsaturated fats, such as sunflower, corn, and soybean oils, walnuts, pine nuts, sesame seeds, sunflower seeds, and flaxseed. Beverages Aim for 6 eight-ounce glasses of water per day. Limit coffee to 3-5 eight-ounce cups per day. Limit caffeinated beverages that have added calories, such as soda and energy drinks. If you drink alcohol: Limit how much you have to: 0-1 drink a day if you are female. 0-2 drinks a day if you are  female. Know how much alcohol is in your drink. In the [place] one drink is one 12 oz bottle of beer (355 mL), one 5 oz glass of wine (148 mL), or one 1 oz glass of hard liquor (44 mL). Seasoning and other foods Try not to add too much salt to your food. Try using herbs and spices instead of salt. Try not to add sugar to food. This information is based on [place] nutrition guidelines. To learn more, visit [employer].be. Exact amounts may vary. You may need different amounts. This information is not intended to replace advice given to you by your health care provider. Make sure you discuss any questions you have with your health care provider. Document Revised: 11/26/2021 Document Reviewed: 11/26/2021 Elsevier Patient Education  2024 [employer].

## 2022-10-15 NOTE — Progress Notes (Signed)
Complete Physical  Assessment and Plan:  Encounter for Annual Physical Exam with abnormal findings Due annually  Health Maintenance reviewed Healthy lifestyle reviewed and goals set  Primary hypertension Controlled  Discussed DASH (Dietary Approaches to Stop Hypertension) DASH diet is lower in sodium than a typical American diet. Cut back on foods that are high in saturated fat, cholesterol, and trans fats. Eat more whole-grain foods, fish, poultry, and nuts Remain active and exercise as tolerated daily.  Monitor BP at home-Call if greater than 130/80.  Check CBC/CMP/GFR  Hyperlipidemia, unspecified hyperlipidemia type Discussed lifestyle modifications. Recommended diet heavy in fruits and veggies, omega 3's. Decrease consumption of animal meats, cheeses, and dairy products. Remain active and exercise as tolerated. Continue to monitor. Check lipids.  PreDM Education: Reviewed 'ABCs' of diabetes management  Work towards achieving/maintaining goal: A1C (<7) Blood pressure (<130/80) Cholesterol (LDL <70) Continue Eye Exam yearly  Continue Dental Exam Q6 mo Discussed dietary recommendations Discussed Physical Activity recommendations Foot exam UTD Check A1C  Obesity -BMI 32 Discussed appropriate BMI Diet modification. Physical activity. Encouraged/praised to build confidence.  Vitamin D deficiency Discussed importance of supplement. Continue to monitor  Medication management All medications discussed and reviewed in full. All questions and concerns regarding medications addressed.    History of adenomatous polyp of colon - 2024 Insurance will not pay prior to 10 years.  Due 2024 Unable to afford to self pay Last report reviewed; no precancerous changes on pathology Discussed alternative options  Screening for Cardiovascular Condition Perform EKG  Screening for Hematuria Proteinuria Check UA  Screening for Thyroid Disorder Check TSH  Other  Eczema Halobetasol refilled.  Orders Placed This Encounter  Procedures   CBC with Differential/Platelet   COMPLETE METABOLIC PANEL WITH GFR   Lipid panel   TSH   Hemoglobin A1c   Insulin, random   VITAMIN D 25 Hydroxy (Vit-D Deficiency, Fractures)   Urinalysis, Routine w reflex microscopic   Microalbumin / creatinine urine ratio   Vitamin B12   EKG 12-Lead    Notify office for further evaluation and treatment, questions or concerns if any reported s/s fail to improve.   The patient was advised to call back or seek an in-person evaluation if any symptoms worsen or if the condition fails to improve as anticipated.   Further disposition pending results of labs. Discussed med's effects and SE's.    I discussed the assessment and treatment plan with the patient. The patient was provided an opportunity to ask questions and all were answered. The patient agreed with the plan and demonstrated an understanding of the instructions.  Discussed med's effects and SE's. Screening labs and tests as requested with regular follow-up as recommended.  I provided 35 minutes of face-to-face time during this encounter including counseling, chart review, and critical decision making was preformed.  Today's Plan of Care is based on a patient-centered health care approach known as shared decision making - the decisions, tests and treatments allow for patient preferences and values to be balanced with clinical evidence.    Future Appointments  Date Time Provider Department Center  10/16/2023  3:00 PM Adela Glimpse, NP [facility] None     HPI  62 y.o. female  presents for a complete physical and follow up for has Hypertension; Obesity (BMI 30.0-34.9); History of adenomatous polyp of colon; Other abnormal glucose (prediabetes); Vitamin D deficiency; and Dry eyes on their problem list.   Heather Walton reports to clinic today for annual physical.  Overall she reports feeling well.  She has not new  concerns at this time.  Single, no children. She works as Clinical biochemist by phone, from home. Enjoys her job. Parents passed last year.  Her mood is stable.   She sees GYN annually, Dr. Jackelyn Knife, typically in the fall. Has PAP smear schedule for 11/2022.  Noted reduced eye dryness and burning over covid and requested to continue remote work but slowly phasing back. Follows with ophth provider for dry eyes. She feels as though this was related to the building at work.  This has now improved.    Mom has Alzheimer's with sig decline in the last few years She takes turns with her sisters staying with her and helping.   BMI is Body mass index is 37.02 kg/m., she has been working on diet and exercise. Wt Readings from Last 3 Encounters:  10/15/22 209 lb (94.8 kg)  10/11/21 202 lb (91.6 kg)  12/21/20 199 lb (90.3 kg)   Her blood pressure has been controlled at home, today their BP is BP: 102/78 She does workout, walking a lot. She denies chest pain, shortness of breath, dizziness.   She is not on cholesterol medication and denies myalgias. Her cholesterol is at goal, on review, consistently at goal last 5 checks. The cholesterol last visit was:   Lab Results  Component Value Date   CHOL 168 10/11/2021   HDL 61 10/11/2021   LDLCALC 81 10/11/2021   TRIG 162 (H) 10/11/2021   CHOLHDL 2.8 10/11/2021   She has been working on diet and exercise for prediabetes,  and denies paresthesia of the feet, polydipsia, polyuria and visual disturbances.  Last A1C in the office was:  Lab Results  Component Value Date   HGBA1C 6.0 (H) 10/11/2021   Lab Results  Component Value Date   GFRAA 86 04/20/2020   Patient is on Vitamin D supplement, 5000 IU, takes 46962 IU 2-3 days a week Lab Results  Component Value Date   VD25OH 54 10/11/2021       Current Medications:  Current Outpatient Medications on File Prior to Visit  Medication Sig Dispense Refill   Ascorbic Acid (VITAMIN C) 1000 MG tablet  Take 1,000 mg by mouth daily.     clotrimazole-betamethasone (LOTRISONE) cream Apply 1 application topically 2 (two) times daily. 15 g 2   cycloSPORINE, PF, (CEQUA) 0.09 % SOLN Apply to eye.     halobetasol (ULTRAVATE) 0.05 % ointment Apply topically 2 (two) times daily. To eczema plaques. Do no use on face/hands/underarms. 50 g 0   Magnesium Chloride (MAGNESIUM DR PO) Take 250 mg by mouth daily.      meloxicam (MOBIC) 15 MG tablet Take 1/2 to 1 tablet Daily with Food for Pain & Inflammation 90 tablet 0   Multiple Vitamin (MULTIVITAMIN) tablet Take 1 tablet by mouth daily.     vitamin E 400 UNIT capsule Take 400 Units by mouth daily.     Cholecalciferol (VITAMIN D-3) 5000 UNITS TABS Take 5,000 Units by mouth daily.     dexamethasone (DECADRON) 4 MG tablet Take 1 tab 3 x day - 3 days, then 2 x day - 3 days, then 1 tab daily 20 tablet 0   Lifitegrast 5 % SOLN Apply to eye 2 (two) times daily.     No current facility-administered medications on file prior to visit.   Allergies:  Allergies  Allergen Reactions   Levaquin [Levofloxacin In D5w] Nausea Only   Maxitrol [Neomycin-Polymyxin-Dexameth]     Distorted patient's vision for  over a month   Phentermine    Prilosec [Omeprazole] Rash   Medical History:  She has Hypertension; Obesity (BMI 30.0-34.9); History of adenomatous polyp of colon; Other abnormal glucose (prediabetes); Vitamin D deficiency; and Dry eyes on their problem list.   Health Maintenance:   Immunization History  Administered Date(s) Administered   Influenza-Unspecified 12/07/2013, 01/06/2015, 11/25/2015, 12/08/2017   PFIZER(Purple Top)SARS-COV-2 Vaccination 06/11/2019, 09/08/2019, 01/29/2020, 06/16/2020   PPD Test 08/19/2013   Td 10/06/2019   Tdap 01/12/2010   Zoster Recombinant(Shingrix) 10/02/2019, 12/08/2019   Tetanus: 2021 Pneumonia: N/A Prevnar due age 33 Shingles vaccine: 2/2 shingrix -  Flu vaccine: 2023 get annually  Covid 19: 2/2, 2021, pfizer  Pap: Has  annually at [facility], has scheduled fall 2023 MGM: 10/03/2022 Colonoscopy: 10/2012, 1 adenomatous polyp without high grade dysplasia, Dr. Marina Goodell, due follow up 10/2017 - patient attempted to schedule but was told insurance would not pay until 10 years Due 2024  Last Eye Exam: [facility], 2023, contacts/glasses Last dental exam: Dental works on [employer], goes q100m, overdue   Last derm: 2019  Patient Care Team: Lucky Cowboy, MD as PCP - General (Internal Medicine) Waymon Budge, MD as Consulting Physician (Pulmonary Disease) Hilarie Fredrickson, MD as Consulting Physician (Gastroenterology) Janalyn Harder, MD (Inactive) as Consulting Physician (Dermatology)  Surgical History:  She has a past surgical history that includes Diagnostic laparoscopy (1998). Family History:  Herfamily history includes Alzheimer's disease (age of onset: 48) in her mother; Breast cancer in her cousin; Diabetes in her maternal grandfather, maternal grandmother, paternal grandfather, and paternal grandmother; Hypertension in her mother; Lung cancer (age of onset: 62) in her father. Social History:  She reports that she has never smoked. She has never used smokeless tobacco. She reports that she does not drink alcohol and does not use drugs.  Review of Systems: Review of Systems  Constitutional: Negative.  Negative for malaise/fatigue and weight loss.  HENT: Negative.  Negative for hearing loss and tinnitus.   Eyes: Negative.  Negative for blurred vision and double vision.  Respiratory: Negative.  Negative for cough, sputum production, shortness of breath and wheezing.   Cardiovascular: Negative.  Negative for chest pain, palpitations, orthopnea, claudication, leg swelling and PND.  Gastrointestinal: Negative.  Negative for abdominal pain, blood in stool, constipation, diarrhea, heartburn, melena, nausea and vomiting.  Genitourinary: Negative.   Musculoskeletal: Negative.  Negative for falls, joint pain and  myalgias.  Skin:  Negative for itching and rash.  Neurological: Negative.  Negative for dizziness, tingling, sensory change, weakness and headaches.  Endo/Heme/Allergies: Negative.  Negative for polydipsia.  Psychiatric/Behavioral: Negative.  Negative for depression, memory loss, substance abuse and suicidal ideas. The patient is not nervous/anxious and does not have insomnia.   All other systems reviewed and are negative.   Physical Exam: Estimated body mass index is 37.02 kg/m as calculated from the following:   Height as of this encounter: 5\' 3"  (1.6 m).   Weight as of this encounter: 209 lb (94.8 kg). BP 102/78   Pulse (!) 59   Temp 97.6 F (36.4 C)   Ht 5\' 3"  (1.6 m)   Wt 209 lb (94.8 kg)   SpO2 98%   BMI 37.02 kg/m  General Appearance: Well nourished, in no apparent distress.  Eyes: PERRLA, EOMs, conjunctiva no swelling or erythema Sinuses: No Frontal/maxillary tenderness  ENT/Mouth: Ext aud canals clear, normal light reflex with TMs without erythema, bulging. Good dentition. No erythema, swelling, or exudate on post pharynx. Tonsils  not swollen or erythematous. Hearing normal.  Neck: Supple, thyroid normal. No bruits  Respiratory: Respiratory effort normal, BS equal bilaterally without rales, rhonchi, wheezing or stridor.  Cardio: RRR without murmurs, rubs or gallops. Brisk peripheral pulses without edema.  Chest: symmetric, with normal excursions and percussion.  Breasts: defer GYN Abdomen: Soft, nontender, no guarding, rebound, hernias, masses, or organomegaly.  Lymphatics: Non tender without lymphadenopathy.  Genitourinary: defer to GYN Musculoskeletal: Full ROM all peripheral extremities,5/5 strength, and normal gait.  Skin: Warm, dry without rashes, lesions, ecchymosis. Neuro: Cranial nerves intact, reflexes equal bilaterally. Normal muscle tone, no cerebellar symptoms. Sensation intact.  Psych: Awake and oriented X 3, normal affect, Insight and Judgment appropriate.    EKG:  Sinus brady, WNL - defer every other year.  Laporche Martelle 3:48 PM [facility]

## 2022-10-19 ENCOUNTER — Encounter: Payer: Self-pay | Admitting: Nurse Practitioner

## 2022-10-21 ENCOUNTER — Encounter: Payer: Self-pay | Admitting: Nurse Practitioner

## 2022-10-24 DIAGNOSIS — L810 Postinflammatory hyperpigmentation: Secondary | ICD-10-CM | POA: Diagnosis not present

## 2022-10-24 DIAGNOSIS — L249 Irritant contact dermatitis, unspecified cause: Secondary | ICD-10-CM | POA: Diagnosis not present

## 2022-10-24 DIAGNOSIS — L2089 Other atopic dermatitis: Secondary | ICD-10-CM | POA: Diagnosis not present

## 2022-10-30 ENCOUNTER — Other Ambulatory Visit: Payer: Self-pay | Admitting: Nurse Practitioner

## 2022-10-30 ENCOUNTER — Telehealth: Payer: Self-pay | Admitting: Nurse Practitioner

## 2022-10-30 NOTE — Telephone Encounter (Signed)
Pt said when she was seen last, tonya noticed redness around her eye, and recommenced for her to use an over the counter cream- but that did not help. Pt is looking for different recommendations.

## 2022-10-30 NOTE — Telephone Encounter (Signed)
LMOM for patient to call back.

## 2022-10-31 ENCOUNTER — Other Ambulatory Visit: Payer: Self-pay | Admitting: Nurse Practitioner

## 2022-10-31 MED ORDER — ERYTHROMYCIN 5 MG/GM OP OINT: TOPICAL_OINTMENT | OPHTHALMIC | 0 refills | Status: DC

## 2022-11-01 NOTE — Telephone Encounter (Signed)
Spoke with patient last night and let her know it was called in.

## 2022-11-07 ENCOUNTER — Encounter: Payer: Self-pay | Admitting: Nurse Practitioner

## 2022-11-07 ENCOUNTER — Ambulatory Visit (INDEPENDENT_AMBULATORY_CARE_PROVIDER_SITE_OTHER): Payer: BC Managed Care – PPO | Admitting: Nurse Practitioner

## 2022-11-07 ENCOUNTER — Ambulatory Visit: Payer: BC Managed Care – PPO | Admitting: Nurse Practitioner

## 2022-11-07 VITALS — BP 130/80 | HR 55 | Temp 97.7°F | Wt 205.0 lb

## 2022-11-07 DIAGNOSIS — L308 Other specified dermatitis: Secondary | ICD-10-CM

## 2022-11-07 DIAGNOSIS — Z79899 Other long term (current) drug therapy: Secondary | ICD-10-CM | POA: Diagnosis not present

## 2022-11-07 NOTE — Progress Notes (Signed)
Assessment and Plan:  Heather Walton was seen today for an episodic visit.  Diagnoses and all order for this visit:  There are no diagnoses linked to this encounter.  Other eczema Stop erythromycin cream  Start Zyrtec as directed Alternate warm and cool compress to eyes to aide in any increase in discharge that may clump Discussed following up with eye doctor sooner if s/s fail to improve.   Medication management All medications discussed and reviewed in full. All questions and concerns regarding medications addressed.     Notify office for further evaluation and treatment, questions or concerns if s/s fail to improve. The risks and benefits of my recommendations, as well as other treatment options were discussed with the patient today. Questions were answered.  Further disposition pending results of labs. Discussed med's effects and SE's.    Over 15 minutes of exam, counseling, chart review, and critical decision making was performed.   Future Appointments  Date Time Provider Department Center  10/16/2023  3:00 PM Heather Walton, Heather Patten, NP [facility] None    ------------------------------------------------------------------------------------------------------------------   HPI BP 130/80   Pulse (!) 55   Temp 97.7 F (36.5 C)   Wt 205 lb (93 kg)   SpO2 99%   BMI 36.31 kg/m   62 y.o.female presents for evaluation of worsening eczema reaction around both eyes.  She was seen for CPE on 10/15/22 and advised to use Halobetasol cream to a small area of right upper eye.  States that area had worsened over the next several day to which she was instructed to start erythromycin cream for possible pink eye however, unknown allergy to Maxitrol caused further reaction with the erythromycin.  Does report a gritty feeling to the eye.  She denies blurry vision or vision changes, fever, chills, N/V.    Past Medical History:  Diagnosis Date   DDD (degenerative disc disease), lumbar    GERD  (gastroesophageal reflux disease)    Hemorrhoid    HSV-1 infection    Hypertension    OSA (obstructive sleep apnea)    Vitamin D deficiency      Allergies  Allergen Reactions   Levaquin [Levofloxacin In D5w] Nausea Only   Maxitrol [Neomycin-Polymyxin-Dexameth]     Distorted patient's vision for over a month   Phentermine    Prilosec [Omeprazole] Rash    Current Outpatient Medications on File Prior to Visit  Medication Sig   Ascorbic Acid (VITAMIN C) 1000 MG tablet Take 1,000 mg by mouth daily.   clotrimazole-betamethasone (LOTRISONE) cream Apply 1 application topically 2 (two) times daily.   cycloSPORINE, PF, (CEQUA) 0.09 % SOLN Apply to eye.   halobetasol (ULTRAVATE) 0.05 % ointment Apply topically 2 (two) times daily. To eczema plaques. Do no use on face/hands/underarms.   Magnesium Chloride (MAGNESIUM DR PO) Take 250 mg by mouth daily.    meloxicam (MOBIC) 15 MG tablet Take 1/2 to 1 tablet Daily with Food for Pain & Inflammation   Multiple Vitamin (MULTIVITAMIN) tablet Take 1 tablet by mouth daily.   vitamin E 400 UNIT capsule Take 400 Units by mouth daily.   erythromycin ophthalmic ointment Apply 1 cm ribbon in right eye TID x 7 days. (Patient not taking: Reported on 11/07/2022)   No current facility-administered medications on file prior to visit.    ROS: all negative except what is noted in the HPI.   Physical Exam:  BP 130/80   Pulse (!) 55   Temp 97.7 F (36.5 C)   Wt 205 lb (  93 kg)   SpO2 99%   BMI 36.31 kg/m   General Appearance: NAD.  Awake, conversant and cooperative. Eyes: PERRLA, EOMs intact.  Sclera white.  Conjunctiva without erythema. Sinuses: No frontal/maxillary tenderness.  No nasal discharge. Nares patent.  ENT/Mouth: Ext aud canals clear.  Bilateral TMs w/DOL and without erythema or bulging. Hearing intact.  Posterior pharynx without swelling or exudate.  Tonsils without swelling or erythema.  Neck: Supple.  No masses, nodules or  thyromegaly. Respiratory: Effort is regular with non-labored breathing. Breath sounds are equal bilaterally without rales, rhonchi, wheezing or stridor.  Cardio: RRR with no MRGs. Brisk peripheral pulses without edema.  Abdomen: Active BS in all four quadrants.  Soft and non-tender without guarding, rebound tenderness, hernias or masses. Lymphatics: Non tender without lymphadenopathy.  Musculoskeletal: Full ROM, 5/5 strength, normal ambulation.  No clubbing or cyanosis. Skin: Bilateral periorbital sking with mild edema and erythema.  Tender to palpation.  Surrounding skin appropriate color for ethnicity.  Neuro: CN II-XII grossly normal. Normal muscle tone without cerebellar symptoms and intact sensation.   Psych: AO X 3,  appropriate mood and affect, insight and judgment.     Adela Glimpse, NP 10:40 PM [facility]

## 2022-11-08 ENCOUNTER — Telehealth: Payer: Self-pay | Admitting: Nurse Practitioner

## 2022-11-08 ENCOUNTER — Encounter: Payer: Self-pay | Admitting: Nurse Practitioner

## 2022-11-08 NOTE — Telephone Encounter (Signed)
Patient states she is feeling some better but her eyes are itching really bad. Please advise

## 2022-11-11 ENCOUNTER — Encounter: Payer: Self-pay | Admitting: Nurse Practitioner

## 2022-11-12 DIAGNOSIS — L2389 Allergic contact dermatitis due to other agents: Secondary | ICD-10-CM | POA: Diagnosis not present

## 2022-11-12 NOTE — Patient Instructions (Signed)
Drug Allergy  A drug allergy happens when the body's disease-fighting system (immune system) reacts badly to a medicine. Drug allergies range from mild to severe. A drug allergy is not the same as a medicine side effect, which is a known possible reaction to the drug. A drug allergy is also different from medicine toxicity caused by an overdose of the drug. The time of an allergic reaction varies. Symptoms often appear between 1 to 2 hours after taking the medicine; however, some allergic reactions occur 1 week or more after you are exposed to a medicine (delayed reaction). A sudden (acute), severe allergic reaction that affects multiple areas of the body is called an anaphylactic reaction (anaphylaxis). Anaphylaxis can be life-threatening. All allergic reactions to a medicine require medical evaluation, even if the allergic reaction appears to be mild. What are the causes? This condition is caused by the immune system wrongly identifying a medication as being harmful. When this happens, the body releases proteins (antibodies) and other compounds, such as histamine, into the bloodstream. This causes swelling in certain tissues and reduces blood flow to important areas, such as the heart and lungs. Almost any medicine can cause an allergic reaction. Medicines that commonly cause allergic reactions (common allergens) include: Antibiotics, such as penicillin. Sulfa medicines (sulfonamides). Medicines that numb certain areas of the body (local anesthetics). X-ray dyes that contain iodine. Pain-relievers. This includes aspirin and NSAIDs, such as ibuprofen or naproxen sodium. Chemotherapy drugs for treating cancer. Medicines for autoimmune diseases, such as rheumatoid arthritis. What are the signs or symptoms? Common symptoms of a mild allergic reaction include: Nasal congestion. Tingling in the mouth or tongue. An itchy, red rash. Common symptoms of a severe allergic reaction include: Swelling of the  face, eyes, lips, or tongue, including the back of the mouth and throat. Difficulty speaking (hoarseness) or swallowing, or making high-pitched whistling sounds, most often when you breathe out (wheezing). Itchy, red, swollen areas of skin (hives). Dizziness, light-headedness, or fainting. Anxiety or confusion. Chest tightness and fast or irregular heartbeats (palpitations). Abdominal pain, vomiting, or diarrhea. How is this diagnosed? This condition is diagnosed based on a physical exam and your history of recent exposure to one or more medicines. You may be referred for follow-up testing by a health care provider who specializes in allergies. This testing can confirm the diagnosis of a drug allergy and determine which medicines you are allergic to. Testing may include: Skin tests. These may involve: Injecting a small amount of the possible allergen between layers of your skin (intradermal injection). Applying patches to your skin. Blood tests. Drug challenge. For this test, a health care provider gives you a small amount of a medicine in gradual doses while watching for an allergic reaction. If you are unsure of what caused your allergic reaction, your health care provider may ask you for: Information about all medicines that you take on a regular basis. The date and time of your reaction. How is this treated? There is no cure for allergies. However, an allergic reaction can be treated with: Medicines that help: Reduce pain and swelling (NSAIDs). Relieve itching and hives (antihistamines). Reduce swelling (corticosteroids). Respiratory inhalers. These are inhaled medicines that help open (dilate) the airways in your lungs. Injections of medicine that helps to relax the muscles in your airways and tighten your blood vessels (epinephrine). Severe allergic reactions, such as anaphylaxis, require immediate treatment in a hospital. You may need to be hospitalized for observation. You may also  be prescribed rescue medicines,  such as epinephrine. Epinephrine comes in many forms, including what is commonly called an auto-injector "pen" (pre-filled automatic epinephrine injection device). Follow these instructions at home: If you have a severe allergy  Always keep an auto-injector pen or your anaphylaxis kit near you. This can be lifesaving if you have a severe reaction. Use your auto-injector pen or anaphylaxis kit as told by your health care provider. Make sure that you, the members of your household, and your employer know: How to use your auto-injector pen or anaphylaxis kit. How to use your auto-injector pen to give you an epinephrine injection. Replace your auto-injector pen or anaphylaxis kit immediately after use, in case you have another reaction. Wear a medical alert bracelet or necklace that states your drug allergy, if told by your health care provider. General instructions Avoid medicines that you are allergic to. Take over-the-counter and prescription medicines only as told by your health care provider. If you were given medicines to treat your allergic reaction, do not drive until your health care provider tells you it is safe. If you have hives or a rash: Use an over-the-counter antihistamine as told by your health care provider. Apply cold, wet cloths (cold compresses) to your skin or take baths or showers in cool water. Avoid hot water. If you had tests done, it is up to you to get your test results. Ask your health care provider when your results will be ready. Tell all your health care providers that you have a drug allergy. Keep all follow-up visits This is important. Contact a health care provider if: You think that you are having a mild allergic reaction. Symptoms of an allergic reaction usually start within 1 hour after you are exposed to a medicine. You have symptoms that last more than 2 days after your reaction. You develop new signs or symptoms. Get help  right away if: You needed to use epinephrine. An epinephrine injection helps to manage life-threatening allergic reactions, but you still need to go to the emergency room even if epinephrine seems to work. This is important because anaphylaxis may happen again within 72 hours (rebound anaphylaxis). If you used epinephrine to treat anaphylaxis outside of the hospital, you need additional medical care. This may include more doses of epinephrine. You develop signs or symptoms of a severe allergic reaction. These symptoms may represent a serious problem that is an emergency. Do not wait to see if the symptoms will go away. Use your auto-injector pen or anaphylaxis kit as you have been instructed, and get medical help right away. Call your local emergency services (911 in the [place] Do not drive yourself to the hospital. Summary A drug allergy happens when the body's disease-fighting system reacts badly to a medicine. Drug allergies range from mild to severe. In some cases, an allergic reaction may be life-threatening. If you have a severe allergy, always keep an auto-injector pen or your anaphylaxis kit near you. This information is not intended to replace advice given to you by your health care provider. Make sure you discuss any questions you have with your health care provider. Document Revised: 08/07/2020 Document Reviewed: 08/07/2020 Elsevier Patient Education  2024 [employer].

## 2022-11-13 ENCOUNTER — Telehealth: Payer: Self-pay | Admitting: Nurse Practitioner

## 2022-11-13 NOTE — Telephone Encounter (Signed)
Pt wanted to let you know that the Triamcinolone cream her eye doctor gave her is finally helping.

## 2022-11-28 DIAGNOSIS — Z01419 Encounter for gynecological examination (general) (routine) without abnormal findings: Secondary | ICD-10-CM | POA: Diagnosis not present

## 2023-02-27 DIAGNOSIS — L2089 Other atopic dermatitis: Secondary | ICD-10-CM | POA: Diagnosis not present

## 2023-02-27 DIAGNOSIS — L239 Allergic contact dermatitis, unspecified cause: Secondary | ICD-10-CM | POA: Diagnosis not present

## 2023-04-02 ENCOUNTER — Encounter: Payer: Self-pay | Admitting: Allergy

## 2023-04-02 ENCOUNTER — Ambulatory Visit (INDEPENDENT_AMBULATORY_CARE_PROVIDER_SITE_OTHER): Payer: BC Managed Care – PPO | Admitting: Allergy

## 2023-04-02 ENCOUNTER — Other Ambulatory Visit: Payer: Self-pay

## 2023-04-02 VITALS — BP 130/92 | HR 59 | Temp 98.1°F | Resp 19 | Ht 60.75 in | Wt 203.9 lb

## 2023-04-02 DIAGNOSIS — L2389 Allergic contact dermatitis due to other agents: Secondary | ICD-10-CM

## 2023-04-02 NOTE — Progress Notes (Signed)
New Patient Note  RE: Heather Walton MRN: [id number] DOB: [birth year] Date of Office Visit: 04/02/2023  Consult requested by: Lucky Cowboy, MD Primary care provider: Lucky Cowboy, MD  Chief Complaint: Establish Care (Break out around eyes and eyelids. Dermatologist is not sure what is causing it. )  History of Present Illness: I had the pleasure of seeing Heather Walton for initial evaluation at the [facility] on 04/02/2023. She is a 63 y.o. female, who is referred here by Arbutus Leas, PA (dermatology) for the evaluation of rash.   Discussed the use of AI scribe software for clinical note transcription with the patient, who gave verbal consent to proceed.  The patient, with a history of sensitive skin, presents with a persistent rash around the eyes that began in early summer. The rash is characterized by redness, crustiness, and darkening of the skin around the eyes. The patient reports that the rash flares up, lasting for a couple of hours before gradually fading over a day or two. The patient denies any associated systemic symptoms but notes that her eyes often appear red, which she likens to appearing intoxicated or under the influence of substances.  The patient has been using a prescribed cream, Opzulera, twice daily, which has significantly improved the symptoms. However, the rash recurs when the cream is discontinued. The patient also takes Zyrtec daily for itching. The patient has not identified any triggers for the rash and reports no changes in her environment or personal care products. She has been using the same laundry detergent, Gain, and fabric softener, Downy, for many years.  The patient has a history of eczema, which runs in her family, but reports that this current rash is unlike any previous skin issues she has experienced. The patient has a cat at home, which she has had for six years.  The patient has been seen by a dermatologist for this  issue, who prescribed several creams, including hydroquinolone and triamcinolone, but none have been as effective as Opzulera. The dermatologist did not perform any patch testing or biopsies. The patient has not required any steroid shots or pills for this issue.     Rash started in June 2024 ago. Mainly occurs around her eyes. Describes them as itchy, crusty, red, and her eyes get red as well. Individual rashes lasts about 1-2 days. No ecchymosis upon resolution. Associated symptoms include: none.  Frequency of episodes: daily. Suspected triggers are unknown. Denies any fevers, chills, changes in medications, foods, personal care products or recent infections.  Currently using regular gain detergent, downy dryer sheets. Uses cocoa butter lotion.  She has tried the following therapies: opzelura BID with good benefit. Systemic steroids: no. Currently on zyrtec 10mg  daily.  Previous work up includes: saw dermatology, no prior patch testing. Previous history of rash/hives: none. Patient had eczema on the hands years ago.  Assessment and Plan: Heather Walton is a 63 y.o. female with: Allergic contact dermatitis due to other agents Chronic rash around the eyes since June, with symptoms of redness, crusting, and itching. Symptoms improve with Opzelera cream. No known changes in personal care products or environment. History of milder rashes/eczema in the past.  Based on history and distribution concerning for contact dermatitis. Recommend patch testing. Please bring your contact solution as well so we can patch test to that.  If negative then will recommend environmental skin testing next.  Keep track of rashes and take pictures. Write down what you had done during flares.  See below for proper skin care. Use fragrance free and dye free products. No dryer sheets or fabric softener.   Okay to use opzelura twice a day as before.  Return for Patch testing.  No orders of the defined types were placed in  this encounter.  Lab Orders  No laboratory test(s) ordered today    Other allergy screening: Asthma: no Rhino conjunctivitis: no Food allergy: no Medication allergy: no Hymenoptera allergy: no Urticaria: no History of recurrent infections suggestive of immunodeficency: no  Diagnostics: None.  Past Medical History: Patient Active Problem List   Diagnosis Date Noted   Dry eyes 10/11/2020   Other abnormal glucose (prediabetes) 01/05/2018   Vitamin D deficiency 01/05/2018   History of adenomatous polyp of colon 09/29/2017   Obesity (BMI 30.0-34.9) 12/01/2013   Hypertension    Past Medical History:  Diagnosis Date   DDD (degenerative disc disease), lumbar    Eczema    GERD (gastroesophageal reflux disease)    Hemorrhoid    HSV-1 infection    Hypertension    OSA (obstructive sleep apnea)    Vitamin D deficiency    Past Surgical History: Past Surgical History:  Procedure Laterality Date   DIAGNOSTIC LAPAROSCOPY  03/11/1996   Medication List:  Current Outpatient Medications  Medication Sig Dispense Refill   Ascorbic Acid (VITAMIN C) 1000 MG tablet Take 1,000 mg by mouth daily.     cycloSPORINE, PF, (CEQUA) 0.09 % SOLN Apply to eye.     meloxicam (MOBIC) 15 MG tablet Take 1/2 to 1 tablet Daily with Food for Pain & Inflammation 90 tablet 0   Multiple Vitamin (MULTIVITAMIN) tablet Take 1 tablet by mouth daily.     OPZELURA 1.5 % CREA      vitamin E 400 UNIT capsule Take 400 Units by mouth daily.     No current facility-administered medications for this visit.   Allergies: Allergies  Allergen Reactions   Levaquin [Levofloxacin In D5w] Nausea Only   Maxitrol [Neomycin-Polymyxin-Dexameth]     Distorted patient's vision for over a month   Phentermine    Prilosec [Omeprazole] Rash   Social History: Social History   Socioeconomic History   Marital status: Single    Spouse name: Not on file   Number of children: Not on file   Years of education: Not on file    Highest education level: Not on file  Occupational History   Occupation: customer service rep  Tobacco Use   Smoking status: Never   Smokeless tobacco: Never  Vaping Use   Vaping status: Never Used  Substance and Sexual Activity   Alcohol use: No   Drug use: No   Sexual activity: Not Currently    Partners: Female  Other Topics Concern   Not on file  Social History Narrative   Not on file   Social Drivers of Health   Financial Resource Strain: Not on file  Food Insecurity: Not on file  Transportation Needs: Not on file  Physical Activity: Unknown (10/01/2018)   Exercise Vital Sign    Days of Exercise per Week: Not on file    Minutes of Exercise per Session: 60 min  Stress: No Stress Concern Present (09/30/2017)   [employer] of Occupational Health - Occupational Stress Questionnaire    Feeling of Stress : Only a little  Social Connections: Not on file   Lives in a house. Smoking: denies Occupation: Designer, jewellery History: Water Damage/mildew in the house: no Carpet in the  family room: yes Carpet in the bedroom: yes Heating: electric Cooling: central Pet: yes 1 cat x 6 yrs  Family History: Family History  Problem Relation Age of Onset   Eczema Mother    Hypertension Mother    Alzheimer's disease Mother 42   Lung cancer Father 42       smoker   Eczema Sister    Asthma Sister    Diabetes Maternal Grandmother    Diabetes Maternal Grandfather    Diabetes Paternal Grandmother    Diabetes Paternal Grandfather    Breast cancer Cousin    Review of Systems  Constitutional:  Negative for appetite change, chills, fever and unexpected weight change.  HENT:  Negative for congestion and rhinorrhea.   Eyes:  Negative for itching.  Respiratory:  Negative for cough, chest tightness, shortness of breath and wheezing.   Cardiovascular:  Negative for chest pain.  Gastrointestinal:  Negative for abdominal pain.  Genitourinary:  Negative for  difficulty urinating.  Skin:  Positive for rash.  Neurological:  Negative for headaches.    Objective: BP (!) 130/92 (BP Location: Right Arm, Patient Position: Sitting, Cuff Size: Large)   Pulse (!) 59   Temp 98.1 F (36.7 C) (Temporal)   Resp 19   Ht 5' 0.75" (1.543 m)   Wt 203 lb 14.4 oz (92.5 kg)   SpO2 98%   BMI 38.84 kg/m  Body mass index is 38.84 kg/m. Physical Exam Vitals and nursing note reviewed.  Constitutional:      Appearance: Normal appearance. She is well-developed.  HENT:     Head: Normocephalic and atraumatic.     Right Ear: Tympanic membrane and external ear normal.     Left Ear: Tympanic membrane and external ear normal.     Nose: Nose normal.     Mouth/Throat:     Mouth: Mucous membranes are moist.     Pharynx: Oropharynx is clear.  Eyes:     Conjunctiva/sclera: Conjunctivae normal.  Cardiovascular:     Rate and Rhythm: Normal rate and regular rhythm.     Heart sounds: Normal heart sounds. No murmur heard.    No friction rub. No gallop.  Pulmonary:     Effort: Pulmonary effort is normal.     Breath sounds: Normal breath sounds. No wheezing, rhonchi or rales.  Musculoskeletal:     Cervical back: Neck supple.  Skin:    General: Skin is warm.     Findings: Rash present.     Comments: Periorbital skin changes b/l.  Neurological:     Mental Status: She is alert and oriented to person, place, and time.  Psychiatric:        Behavior: Behavior normal.   The plan was reviewed with the patient/family, and all questions/concerned were addressed.  It was my pleasure to see Heather Walton today and participate in her care. Please feel free to contact me with any questions or concerns.  Sincerely,  Wyline Mood, DO Allergy & Immunology  [facility] [place] office: [phone] [facility] office: [phone]

## 2023-04-02 NOTE — Patient Instructions (Addendum)
Rash Based on history and distribution concerning for contact dermatitis. Recommend patch testing. Please bring your contact solution as well so we can patch test to that.   Patches are best placed on Monday with return to office on Wednesday and Friday of same week for readings.  Patches once placed should not get wet.  You do not have to stop any medications for patch testing but should not be on oral prednisone. You can schedule a patch testing visit when convenient for your schedule.    Keep track of rashes and take pictures. Write down what you had done during flares.  See below for proper skin care. Use fragrance free and dye free products. No dryer sheets or fabric softener.    Okay to use opzelura twice a day as before.  Follow up for patch testing. If negative then will recommend environmental skin testing next.   Skin care recommendations  Bath time: Always use lukewarm water. AVOID very hot or cold water. Keep bathing time to 5-10 minutes. Do NOT use bubble bath. Use a mild soap and use just enough to wash the dirty areas. Do NOT scrub skin vigorously.  After bathing, pat dry your skin with a towel. Do NOT rub or scrub the skin.  Moisturizers and prescriptions:  ALWAYS apply moisturizers immediately after bathing (within 3 minutes). This helps to lock-in moisture. Use the moisturizer several times a day over the whole body. Good summer moisturizers include: Aveeno, CeraVe, Cetaphil. Good winter moisturizers include: Aquaphor, Vaseline, Cerave, Cetaphil, Eucerin, Vanicream. When using moisturizers along with medications, the moisturizer should be applied about one hour after applying the medication to prevent diluting effect of the medication or moisturize around where you applied the medications. When not using medications, the moisturizer can be continued twice daily as maintenance.  Laundry and clothing: Avoid laundry products with added color or perfumes. Use  unscented hypo-allergenic laundry products such as Tide free, Cheer free & gentle, and All free and clear.  If the skin still seems dry or sensitive, you can try double-rinsing the clothes. Avoid tight or scratchy clothing such as wool. Do not use fabric softeners or dyer sheets.

## 2023-04-18 NOTE — Progress Notes (Signed)
    Follow-up Note  RE: Heather Walton MRN: [id number] DOB: 11-17-1960 Date of Office Visit: 04/21/2023  Primary care provider: Vangie Genet, MD Referring provider: Vangie Genet, MD   Heather Walton returns to the office today for the patch test placement, given suspected history of contact dermatitis.    Diagnostics:  TRUE Test patches placed. And contact solution and eye drops.   Plan:   Suspected Allergic contact dermatitis  Discussed with patient that patch testing tests for contact dermatitis and sometimes it does not correlate to how one will react to  allergens   in the body. Positive patch testing results can help in avoiding those items however it is possible to get false negative results.  Nevertheless, this is one of the most accessible test for  contact dermatitis  currently available  - Instructions provided on care of the patches for the next 48 hours. - Heather Walton was instructed to avoid showering for the next 48 hours. - Heather Walton will follow up in 48 hours and 96 hours for patch readings.     Jonathon Neighbors, MD [facility]

## 2023-04-21 ENCOUNTER — Ambulatory Visit (INDEPENDENT_AMBULATORY_CARE_PROVIDER_SITE_OTHER): Payer: BC Managed Care – PPO | Admitting: Internal Medicine

## 2023-04-21 ENCOUNTER — Encounter: Payer: Self-pay | Admitting: Internal Medicine

## 2023-04-21 VITALS — BP 126/84 | HR 74 | Temp 98.1°F | Resp 16

## 2023-04-21 DIAGNOSIS — L2389 Allergic contact dermatitis due to other agents: Secondary | ICD-10-CM

## 2023-04-22 NOTE — Progress Notes (Unsigned)
   Follow Up Note  RE: Heather Walton MRN: [id number] DOB: [birth year] Date of Office Visit: 04/23/2023  Referring provider: Lucky Cowboy, MD Primary care provider: Lucky Cowboy, MD  History of Present Illness: I had the pleasure of seeing Heather Walton for a follow up visit at the [facility] on 04/23/2023. She is a 63 y.o. female, who is being followed for dermatitis. Today she is here for initial patch test interpretation, given suspected history of contact dermatitis.   Diagnostics:  TRUE TEST 48 hour reading:   T.R.U.E. Test - 04/23/23 1541       Test Information   Time Antigen Placed 1541    Manufacturer Greer    Location Back    Number of Test 36    Reading Interval Day 3;Day 5    Panel Panel 1;Panel 2;Panel 3      Panel 1   1. Nickel Sulfate 0    2. Wool Alcohols 0    3. Neomycin Sulfate 0    4. Potassium Dichromate 0    5. Caine Mix 0    6. Fragrance Mix 0    7. Colophony 0    8. Paraben Mix 0    9. Negative Control 0    10. Balsam of [place] 0    11. Ethylenediamine Dihydrochloride 0    12. Cobalt Dichloride 0      Panel 2   13. p-tert Butylphenol Formaldehyde Resin 0    14. Epoxy Resin 0    15. Carba Mix 0    16.  Black Rubber Mix 0    17. Cl+ Me-Isothiazolinone 1    18. Quaternium-15 0    19. Methyldibromo Glutaronitrile 0    20. p-Phenylenediamine 0    21. Formaldehyde 0    22. Mercapto Mix 0    23. Thimerosal 0    24. Thiuram Mix 0      Panel 3   25. Diazolidinyl Urea 0    26. Quinoline Mix 0    27. Tixocortol-21-Pivalate 0    28. Gold Sodium Thiosulfate 0    29. Imidazolidinyl Urea 0    30. Budesonide 0    31. Hydrocortisone-17-Butyrate 0    32. Mercaptobenzothiazole 0    33. Bacitracin 0    34. Parthenolide 0    35. Disperse Blue 106 0    36. 2-Bromo-2-Nitropropane-1,3-diol 0      Comments   Comments 1) Clear Care-Negative, 2) Eye drops Lasta Caft- Negative              Assessment and Plan: Heather Walton is a 63  y.o. female with: Allergic contact dermatitis due to other agents TRUE patches and 2 extra eye drops were removed. Cl+ Me-Isothiazolinone was positive +1  Return in about 2 days (around 04/25/2023) for Patch reading.  It was my pleasure to see Heather Walton today and participate in her care. Please feel free to contact me with any questions or concerns.  Sincerely,  Wyline Mood, DO Allergy & Immunology  [facility] office: [phone] [facility] office: [phone] [place] office: [phone]

## 2023-04-23 ENCOUNTER — Other Ambulatory Visit: Payer: Self-pay

## 2023-04-23 ENCOUNTER — Encounter: Payer: Self-pay | Admitting: Allergy

## 2023-04-23 ENCOUNTER — Ambulatory Visit (INDEPENDENT_AMBULATORY_CARE_PROVIDER_SITE_OTHER): Payer: BC Managed Care – PPO | Admitting: Allergy

## 2023-04-23 DIAGNOSIS — L2389 Allergic contact dermatitis due to other agents: Secondary | ICD-10-CM

## 2023-04-25 ENCOUNTER — Ambulatory Visit: Payer: BC Managed Care – PPO | Admitting: Internal Medicine

## 2023-04-25 DIAGNOSIS — L2389 Allergic contact dermatitis due to other agents: Secondary | ICD-10-CM

## 2023-04-25 NOTE — Progress Notes (Signed)
   Follow Up Note  RE: SHAWNITA KRIZEK MRN: [id number] DOB: 11-Jan-1961 Date of Office Visit: 04/25/2023  Referring provider: Lucky Cowboy, MD Primary care provider: Lucky Cowboy, MD  History of Present Illness: I had the pleasure of seeing Averleigh Savary for a follow up visit at the [facility] on 04/25/2023. She is a 63 y.o. female, who is being followed for contact dermatitis. Today she is here for final patch test interpretation, given suspected history of contact dermatitis.   Diagnostics:    TRUE TEST 96-hour hour reading:  2+ reaction to #17 (Cl+Me-Isothiazolinone) and 2+ reaction to #23 (Thiomersal)  Assessment and Plan: Concettina is a 63 y.o. female with: Contact Dermatitis:  Continue follow up with Dermatology.  Based on history and distribution concerning for contact dermatitis. Keep track of rashes and take pictures. Write down what you had done during flares.  Okay to use Opzelura twice a day, if needed.  The patient has been provided detailed information regarding the substances she is sensitive to, as well as products containing the substances.  Meticulous avoidance of these substances is recommended. If avoidance is not possible, the use of barrier creams or lotions is recommended. Will send safe product list for reaction to Cl+ Me-Isothiazolinone and thimerosal. Avoid use of products with these chemicals.   Follow up with Dr Selena Batten in 2-3 months.

## 2023-04-25 NOTE — Patient Instructions (Addendum)
Continue follow up with Dermatology.  Based on history and distribution concerning for contact dermatitis. Keep track of rashes and take pictures. Write down what you had done during flares.  Okay to use Opzelura twice a day, if needed.    The patient has been provided detailed information regarding the substances she is sensitive to, as well as products containing the substances.  Meticulous avoidance of these substances is recommended. If avoidance is not possible, the use of barrier creams or lotions is recommended. Will send safe product list for reaction to Cl+ Me-Isothiazolinone and thimerosal. Avoid use of products with these chemicals.     Follow up with Dr Selena Batten in 2-3 months.

## 2023-04-26 ENCOUNTER — Encounter: Payer: Self-pay | Admitting: Internal Medicine

## 2023-05-09 ENCOUNTER — Encounter: Payer: Self-pay | Admitting: Internal Medicine

## 2023-05-15 ENCOUNTER — Telehealth: Payer: Self-pay

## 2023-05-15 ENCOUNTER — Other Ambulatory Visit: Payer: Self-pay

## 2023-05-15 MED ORDER — OPZELURA 1.5 % EX CREA: 1.0000 "application " | TOPICAL_CREAM | Freq: Two times a day (BID) | CUTANEOUS | 2 refills | Status: DC | PRN

## 2023-05-15 NOTE — Telephone Encounter (Signed)
 Patients dermatologist closed and the patients needs a refill on opzelura 1.5% cream. Her PCP passed away and she is working on establishing care with a new one, so we are her next option.  She takes it twice a day (Morning and at Night)   Pharmacy [facility] [phone]

## 2023-05-15 NOTE — Telephone Encounter (Signed)
 Called the patient to let her know [facility] has been sent in to the pharmacy. No answer, LVM.

## 2023-06-02 ENCOUNTER — Telehealth: Payer: Self-pay

## 2023-06-02 NOTE — Patient Instructions (Incomplete)

## 2023-06-02 NOTE — Progress Notes (Unsigned)
 New Patient Office Visit  Subjective    Patient ID: Heather Walton, female    DOB: March 16, 1960  Age: 63 y.o. MRN: [id number]  CC: No chief complaint on file.   HPI Heather Walton presents to establish care today.   Outpatient Encounter Medications as of 06/03/2023  Medication Sig  . Ascorbic Acid (VITAMIN C) 1000 MG tablet Take 1,000 mg by mouth daily.  . cycloSPORINE, PF, (CEQUA) 0.09 % SOLN Apply to eye.  . meloxicam (MOBIC) 15 MG tablet Take 1/2 to 1 tablet Daily with Food for Pain & Inflammation  . Multiple Vitamin (MULTIVITAMIN) tablet Take 1 tablet by mouth daily.  . OPZELURA 1.5 % CREA Apply 1 application  topically 2 (two) times daily as needed.  . vitamin E 400 UNIT capsule Take 400 Units by mouth daily.   No facility-administered encounter medications on file as of 06/03/2023.    Past Medical History:  Diagnosis Date  . DDD (degenerative disc disease), lumbar   . Eczema   . GERD (gastroesophageal reflux disease)   . Hemorrhoid   . HSV-1 infection   . Hypertension   . OSA (obstructive sleep apnea)   . Vitamin D deficiency     Past Surgical History:  Procedure Laterality Date  . DIAGNOSTIC LAPAROSCOPY  03/11/1996    Family History  Problem Relation Age of Onset  . Eczema Mother   . Hypertension Mother   . Alzheimer's disease Mother 7  . Lung cancer Father 39       smoker  . Eczema Sister   . Asthma Sister   . Diabetes Maternal Grandmother   . Diabetes Maternal Grandfather   . Diabetes Paternal Grandmother   . Diabetes Paternal Grandfather   . Breast cancer Cousin     Social History   Socioeconomic History  . Marital status: Single    Spouse name: Not on file  . Number of children: Not on file  . Years of education: Not on file  . Highest education level: Bachelor's degree (e.g., BA, AB, BS)  Occupational History  . Occupation: customer service rep  Tobacco Use  . Smoking status: Never  . Smokeless tobacco: Never  Vaping Use  . Vaping  status: Never Used  Substance and Sexual Activity  . Alcohol use: No  . Drug use: No  . Sexual activity: Not Currently    Partners: Female  Other Topics Concern  . Not on file  Social History Narrative  . Not on file   Social Drivers of Health   Financial Resource Strain: Patient Declined (05/31/2023)   Overall Financial Resource Strain (CARDIA)   . Difficulty of Paying Living Expenses: Patient declined  Food Insecurity: Patient Declined (05/31/2023)   Hunger Vital Sign   . Worried About Programme researcher, broadcasting/film/video in the Last Year: Patient declined   . Ran Out of Food in the Last Year: Patient declined  Transportation Needs: No Transportation Needs (05/31/2023)   PRAPARE - Transportation   . Lack of Transportation (Medical): No   . Lack of Transportation (Non-Medical): No  Physical Activity: Insufficiently Active (05/31/2023)   Exercise Vital Sign   . Days of Exercise per Week: 3 days   . Minutes of Exercise per Session: 30 min  Stress: No Stress Concern Present (05/31/2023)   [employer] of Occupational Health - Occupational Stress Questionnaire   . Feeling of Stress : Not at all  Social Connections: Unknown (05/31/2023)   Social Connection and Isolation Panel [NHANES]   .  Frequency of Communication with Friends and Family: Patient declined   . Frequency of Social Gatherings with Friends and Family: Patient declined   . Attends Religious Services: Patient declined   . Active Member of Clubs or Organizations: No   . Attends Banker Meetings: Not on file   . Marital Status: Never married  Intimate Partner Violence: Not on file    ROS Per HPI      Objective    There were no vitals taken for this visit.  Physical Exam Vitals and nursing note reviewed.  Constitutional:      Appearance: Normal appearance. She is normal weight.  HENT:     Head: Normocephalic and atraumatic.     Right Ear: Tympanic membrane and ear canal normal.     Left Ear: Tympanic  membrane and ear canal normal.     Nose: Nose normal. No congestion.     Mouth/Throat:     Mouth: Mucous membranes are moist.     Pharynx: Oropharynx is clear. No oropharyngeal exudate or posterior oropharyngeal erythema.  Eyes:     Extraocular Movements: Extraocular movements intact.     Pupils: Pupils are equal, round, and reactive to light.  Cardiovascular:     Rate and Rhythm: Normal rate and regular rhythm.     Heart sounds: Normal heart sounds.  Pulmonary:     Effort: Pulmonary effort is normal. No respiratory distress.     Breath sounds: Normal breath sounds.  Musculoskeletal:        General: Normal range of motion.     Cervical back: Normal range of motion and neck supple.  Lymphadenopathy:     Cervical: No cervical adenopathy.  Skin:    General: Skin is warm and dry.  Neurological:     General: No focal deficit present.     Mental Status: She is alert and oriented to person, place, and time.  Psychiatric:        Mood and Affect: Mood normal.        Thought Content: Thought content normal.       Assessment & Plan:   Primary hypertension  Vitamin D deficiency  Obesity (BMI 30.0-34.9)  Other abnormal glucose (prediabetes)  Dry eyes     No follow-ups on file.   Moshe Cipro, FNP

## 2023-06-02 NOTE — Telephone Encounter (Signed)
*  Asthma/Allergy  Pharmacy Patient Advocate Encounter  Received notification from [facility] that Prior Authorization for Opzelura 1.5% cream  has been APPROVED from 06/02/2023 to 06/01/2024   PA #/Case ID/Reference #: [id number] Fillers

## 2023-06-03 ENCOUNTER — Encounter: Payer: Self-pay | Admitting: Family Medicine

## 2023-06-03 ENCOUNTER — Ambulatory Visit (INDEPENDENT_AMBULATORY_CARE_PROVIDER_SITE_OTHER): Payer: Self-pay | Admitting: Family Medicine

## 2023-06-03 VITALS — BP 126/80 | HR 66 | Temp 97.8°F | Ht 60.75 in | Wt 208.0 lb

## 2023-06-03 DIAGNOSIS — E559 Vitamin D deficiency, unspecified: Secondary | ICD-10-CM | POA: Diagnosis not present

## 2023-06-03 DIAGNOSIS — H04123 Dry eye syndrome of bilateral lacrimal glands: Secondary | ICD-10-CM

## 2023-06-03 DIAGNOSIS — I10 Essential (primary) hypertension: Secondary | ICD-10-CM | POA: Diagnosis not present

## 2023-06-03 DIAGNOSIS — R7309 Other abnormal glucose: Secondary | ICD-10-CM | POA: Diagnosis not present

## 2023-06-03 DIAGNOSIS — E66811 Obesity, class 1: Secondary | ICD-10-CM | POA: Diagnosis not present

## 2023-06-04 ENCOUNTER — Encounter: Payer: Self-pay | Admitting: Family Medicine

## 2023-06-04 NOTE — Assessment & Plan Note (Signed)
 Continue efforts and healthy diet and activity level

## 2023-06-04 NOTE — Assessment & Plan Note (Signed)
 Continue current medication regimen, DASH diet

## 2023-06-04 NOTE — Assessment & Plan Note (Signed)
 Continue working on decreasing sweets and carbs in the diet, healthy activity level

## 2023-06-04 NOTE — Assessment & Plan Note (Signed)
 Continue supplementation, will check labs at physical

## 2023-06-04 NOTE — Assessment & Plan Note (Signed)
 Continue cyclosporine drops

## 2023-07-01 ENCOUNTER — Telehealth (INDEPENDENT_AMBULATORY_CARE_PROVIDER_SITE_OTHER): Admitting: Family Medicine

## 2023-07-01 ENCOUNTER — Encounter: Payer: Self-pay | Admitting: Family Medicine

## 2023-07-01 DIAGNOSIS — J019 Acute sinusitis, unspecified: Secondary | ICD-10-CM

## 2023-07-01 DIAGNOSIS — B9689 Other specified bacterial agents as the cause of diseases classified elsewhere: Secondary | ICD-10-CM | POA: Diagnosis not present

## 2023-07-01 MED ORDER — AMOXICILLIN-POT CLAVULANATE 875-125 MG PO TABS: 1.0000 | ORAL_TABLET | Freq: Two times a day (BID) | ORAL | 0 refills | Status: DC

## 2023-07-01 MED ORDER — AZELASTINE HCL 0.1 % NA SOLN: 1.0000 | Freq: Two times a day (BID) | NASAL | 5 refills | Status: AC

## 2023-07-01 NOTE — Patient Instructions (Signed)
 I have sent in Augmentin  for you to take twice a day for 10 days.  This medication can upset your stomach, so I tell everyone to take it with a meal.  I have sent in azelastine  nasal spray for you to use twice a day while you are taking the antibiotic to help get some symptomatic relief.  May continue this after antibiotic completion if it is still helping.  Follow-up with me as needed with no improvement over the next 2 to 3 days.

## 2023-07-01 NOTE — Progress Notes (Signed)
 Virtual Visit via Video Note  I connected with Heather Walton on 07/01/23 at  2:00 PM EDT by a video enabled telemedicine application and verified that I am speaking with the correct person using two identifiers.  Patient Location: Home Provider Location: Office/Clinic  I discussed the limitations, risks, security, and privacy concerns of performing an evaluation and management service by video and the availability of in person appointments. I also discussed with the patient that there may be a patient responsible charge related to this service. The patient expressed understanding and agreed to proceed.  Subjective: PCP: Wellington Half, FNP  Chief Complaint  Patient presents with   Acute Visit    Ongoing for a few days. Dizzy, lightheaded, denies nasal congestion, is having facial pressure.Has tried sinus pain medications. Had this same issue a few years ago, and it was a sinus infection   HPI 63 yo F presents for AV visit for evaluation of possible sinus infection.  Reports sinus congestion, sinus pain and pressure, dizziness, fatigue for the last 5 days.  Reports increased allergy  symptoms for a week prior. Has tried OTC cough and cold with no relief. Denies known sick contacts. Denies abdominal pain, nausea, vomiting, diarrhea, rash, fever, chills, other symptoms. Medical history as outlined below.   ROS: Per HPI  Current Outpatient Medications:    amoxicillin -clavulanate (AUGMENTIN ) 875-125 MG tablet, Take 1 tablet by mouth 2 (two) times daily., Disp: 20 tablet, Rfl: 0   Ascorbic Acid (VITAMIN C) 1000 MG tablet, Take 1,000 mg by mouth daily., Disp: , Rfl:    azelastine  (ASTELIN ) 0.1 % nasal spray, Place 1 spray into both nostrils 2 (two) times daily. Use in each nostril as directed, Disp: 30 mL, Rfl: 5   cycloSPORINE, PF, (CEQUA) 0.09 % SOLN, Apply to eye., Disp: , Rfl:    meloxicam  (MOBIC ) 15 MG tablet, Take 1/2 to 1 tablet Daily with Food for Pain & Inflammation,  Disp: 90 tablet, Rfl: 0   Multiple Vitamin (MULTIVITAMIN) tablet, Take 1 tablet by mouth daily., Disp: , Rfl:    OPZELURA  1.5 % CREA, Apply 1 application  topically 2 (two) times daily as needed., Disp: 60 g, Rfl: 2   vitamin E 400 UNIT capsule, Take 400 Units by mouth daily., Disp: , Rfl:   Observations/Objective: There were no vitals filed for this visit. Physical Exam Vitals and nursing note reviewed.  Constitutional:      General: She is not in acute distress.    Comments: Appears fatigued  HENT:     Head: Normocephalic and atraumatic.     Nose: Congestion present.     Mouth/Throat:     Comments: Sounds congested Eyes:     Extraocular Movements: Extraocular movements intact.  Pulmonary:     Effort: Pulmonary effort is normal.  Musculoskeletal:     Cervical back: Normal range of motion.  Neurological:     General: No focal deficit present.     Mental Status: She is alert and oriented to person, place, and time.  Psychiatric:        Mood and Affect: Mood normal.        Behavior: Behavior normal.     Assessment and Plan: Acute bacterial sinusitis -     Amoxicillin -Pot Clavulanate; Take 1 tablet by mouth 2 (two) times daily.  Dispense: 20 tablet; Refill: 0 -     Azelastine  HCl; Place 1 spray into both nostrils 2 (two) times daily. Use in each nostril as directed  Dispense: 30 mL; Refill: 5    Follow Up Instructions: Return if symptoms worsen or fail to improve.   I discussed the assessment and treatment plan with the patient. The patient was provided an opportunity to ask questions, and all were answered. The patient agreed with the plan and demonstrated an understanding of the instructions.   The patient was advised to call back or seek an in-person evaluation if the symptoms worsen or if the condition fails to improve as anticipated.  The above assessment and management plan was discussed with the patient. The patient verbalized understanding of and has agreed to the  management plan.   I spent 10 minutes on this patient encounter including counseling, diagnosis, treatment options, documentation, face-to-face time.   Wellington Half, FNP

## 2023-07-01 NOTE — Telephone Encounter (Signed)
 Spoke with patient, scheduled her an appointment for today

## 2023-07-04 ENCOUNTER — Encounter: Payer: Self-pay | Admitting: Family Medicine

## 2023-07-10 ENCOUNTER — Encounter: Payer: Self-pay | Admitting: Family Medicine

## 2023-08-01 ENCOUNTER — Other Ambulatory Visit: Payer: Self-pay | Admitting: Allergy

## 2023-08-12 ENCOUNTER — Encounter: Payer: Self-pay | Admitting: Family Medicine

## 2023-08-14 NOTE — Progress Notes (Unsigned)
   Acute Office Visit  Subjective:     Patient ID: Heather Walton, female    DOB: [birth year], 63 y.o.   MRN: [id number]  No chief complaint on file.   HPI Patient is in today for evaluation of tinnitus.   ROS Per HPI      Objective:    There were no vitals taken for this visit.   Physical Exam Vitals and nursing note reviewed.  Constitutional:      General: She is not in acute distress.    Appearance: Normal appearance. She is normal weight.  HENT:     Head: Normocephalic and atraumatic.     Right Ear: External ear normal.     Left Ear: External ear normal.     Nose: Nose normal.     Mouth/Throat:     Mouth: Mucous membranes are moist.     Pharynx: Oropharynx is clear.  Eyes:     Extraocular Movements: Extraocular movements intact.     Pupils: Pupils are equal, round, and reactive to light.  Cardiovascular:     Rate and Rhythm: Normal rate and regular rhythm.     Pulses: Normal pulses.     Heart sounds: Normal heart sounds.  Pulmonary:     Effort: Pulmonary effort is normal. No respiratory distress.     Breath sounds: Normal breath sounds. No wheezing, rhonchi or rales.  Musculoskeletal:        General: Normal range of motion.     Cervical back: Normal range of motion.     Right lower leg: No edema.     Left lower leg: No edema.  Lymphadenopathy:     Cervical: No cervical adenopathy.  Neurological:     General: No focal deficit present.     Mental Status: She is alert and oriented to person, place, and time.  Psychiatric:        Mood and Affect: Mood normal.        Thought Content: Thought content normal.   No results found for any visits on 08/15/23.      Assessment & Plan:   Primary hypertension  Vitamin D  deficiency     No orders of the defined types were placed in this encounter.   No follow-ups on file.  Wellington Half, FNP

## 2023-08-15 ENCOUNTER — Ambulatory Visit (INDEPENDENT_AMBULATORY_CARE_PROVIDER_SITE_OTHER): Admitting: Family Medicine

## 2023-08-15 ENCOUNTER — Encounter: Payer: Self-pay | Admitting: Family Medicine

## 2023-08-15 VITALS — BP 138/88 | HR 87 | Temp 98.1°F | Ht 60.75 in

## 2023-08-15 DIAGNOSIS — H6121 Impacted cerumen, right ear: Secondary | ICD-10-CM | POA: Insufficient documentation

## 2023-08-15 DIAGNOSIS — E559 Vitamin D deficiency, unspecified: Secondary | ICD-10-CM

## 2023-08-15 DIAGNOSIS — I10 Essential (primary) hypertension: Secondary | ICD-10-CM

## 2023-08-15 DIAGNOSIS — H9319 Tinnitus, unspecified ear: Secondary | ICD-10-CM

## 2023-08-15 DIAGNOSIS — H9311 Tinnitus, right ear: Secondary | ICD-10-CM | POA: Diagnosis not present

## 2023-08-15 NOTE — Patient Instructions (Signed)
 We have cleaned out your ears in the office today.  You may use Debrox at home to try to help keep the wax from building up.  Follow-up with us  as needed if you are having any persistent symptoms.  Follow up with me as scheduled.

## 2023-08-15 NOTE — Assessment & Plan Note (Signed)
 Likely related to cerumen impaction Discussed that this will take a day or so to resolve, but should resolve on its own

## 2023-08-15 NOTE — Assessment & Plan Note (Signed)
 Ear lavage successful, complete removal of impacted cerumen Hearing improved, patient tolerated well

## 2023-09-15 ENCOUNTER — Ambulatory Visit (INDEPENDENT_AMBULATORY_CARE_PROVIDER_SITE_OTHER)
Admission: RE | Admit: 2023-09-15 | Discharge: 2023-09-15 | Disposition: A | Discharge status: Home / Self Care | Source: Ambulatory Visit | Attending: Family Medicine

## 2023-09-15 ENCOUNTER — Encounter: Payer: Self-pay | Admitting: Family Medicine

## 2023-09-15 ENCOUNTER — Ambulatory Visit (INDEPENDENT_AMBULATORY_CARE_PROVIDER_SITE_OTHER): Admitting: Family Medicine

## 2023-09-15 VITALS — BP 140/88 | HR 58 | Temp 97.6°F | Ht 60.75 in | Wt 200.8 lb

## 2023-09-15 DIAGNOSIS — M79672 Pain in left foot: Secondary | ICD-10-CM

## 2023-09-15 DIAGNOSIS — S92355A Nondisplaced fracture of fifth metatarsal bone, left foot, initial encounter for closed fracture: Secondary | ICD-10-CM | POA: Diagnosis not present

## 2023-09-15 DIAGNOSIS — M7732 Calcaneal spur, left foot: Secondary | ICD-10-CM | POA: Diagnosis not present

## 2023-09-15 MED ORDER — MELOXICAM 15 MG PO TABS: 15.0000 mg | ORAL_TABLET | Freq: Every day | ORAL | 1 refills | Status: DC

## 2023-09-15 NOTE — Progress Notes (Signed)
 Acute Office Visit  Subjective:     Patient ID: Heather Walton, female    DOB: [birth year], 63 y.o.   MRN: [id number]  Chief Complaint  Patient presents with   Acute Visit    Left foot, ongoing since Wednesday. Stepped wrong outside the door on a pinecone, has been soaking foot in epsom salt, can only wear crocs. Tried tylenol     HPI  63 year old female presents for evaluation of left lateral foot pain after walking out the front door and states she rolled her ankle over a pinecone. This happened 6 days ago. Has been taking Tylenol with little relief. Has been using ice with some relief. Denies numbness, tingling, other concerns today.   ROS Per HPI      Objective:    BP (!) 140/88 (BP Location: Left Arm, Patient Position: Sitting)   Pulse (!) 58   Temp 97.6 F (36.4 C) (Temporal)   Ht 5' 0.75 (1.543 m)   Wt 200 lb 12.8 oz (91.1 kg)   SpO2 98%   BMI 38.25 kg/m    Physical Exam Vitals and nursing note reviewed.  Constitutional:      General: She is not in acute distress.    Appearance: Normal appearance. She is normal weight.  HENT:     Head: Normocephalic and atraumatic.     Right Ear: External ear normal.     Left Ear: External ear normal.     Nose: Nose normal.     Mouth/Throat:     Mouth: Mucous membranes are moist.     Pharynx: Oropharynx is clear.  Eyes:     Extraocular Movements: Extraocular movements intact.     Pupils: Pupils are equal, round, and reactive to light.  Cardiovascular:     Rate and Rhythm: Normal rate and regular rhythm.     Pulses: Normal pulses.     Heart sounds: Normal heart sounds.  Pulmonary:     Effort: Pulmonary effort is normal. No respiratory distress.     Breath sounds: Normal breath sounds. No wheezing, rhonchi or rales.  Musculoskeletal:        General: Swelling and tenderness present.     Cervical back: Normal range of motion.     Right lower leg: No edema.     Left lower leg: No edema.       Feet:  Feet:      Comments: Area of swelling, tenderness.  No obvious erythema, mild bruising, no obvious deformity Lymphadenopathy:     Cervical: No cervical adenopathy.  Neurological:     General: No focal deficit present.     Mental Status: She is alert and oriented to person, place, and time.  Psychiatric:        Mood and Affect: Mood normal.        Thought Content: Thought content normal.     No results found for any visits on 09/15/23.      Assessment & Plan:   Left foot pain -     DG Foot Complete Left; Future -     Meloxicam ; Take 1 tablet (15 mg total) by mouth daily.  Dispense: 30 tablet; Refill: 1  If x-ray is negative for fracture, will send to sports medicine for further eval If there is a fracture, will refer to sports medicine or Ortho as appropriate  Meds ordered this encounter  Medications   meloxicam  (MOBIC ) 15 MG tablet    Sig: Take 1 tablet (15 mg total) by  mouth daily.    Dispense:  30 tablet    Refill:  1    Return if symptoms worsen or fail to improve.  Heather Walton, Heather Walton

## 2023-09-15 NOTE — Patient Instructions (Addendum)
 [address]  Xray of left foot.  I have sent in Meloxicam  for you to take one tablet daily.  If negative, we will send you to Sports Medicine for an ultrasound.  If there is a fracture then we will get you ortho if needed.

## 2023-09-16 ENCOUNTER — Encounter: Payer: Self-pay | Admitting: Family Medicine

## 2023-09-16 ENCOUNTER — Ambulatory Visit: Payer: Self-pay | Admitting: Family Medicine

## 2023-09-16 NOTE — Telephone Encounter (Signed)
 Spoke with patient, she does need a boot per provider. Sports medicine unable to see patient today, STAT referral placed. Advised patient to check [facility] discount medical supply to try and get a boot in the meantime while we work out referral. Patient gave verbal understanding

## 2023-09-16 NOTE — Telephone Encounter (Signed)
 Copied from CRM [id number]. Topic: Clinical - Red Word Triage >> Sep 16, 2023  1:08 PM Elle L wrote: Red Word that prompted transfer to Nurse Triage: The patient was seen in the office yesterday for her left foot. The patient states that the ice made the pain more severe but she declined Nurse Triage. The patient received her imaging results on MyChart. I advised once they are reviewed by FNP Corean Ku that she will be reached out to with the results. However, the patient is requesting to see if she needs to purchase a boot for her foot as she needs to go pick up her medication but does not want to have to make more than one trip.

## 2023-09-16 NOTE — Addendum Note (Signed)
 Addended by: Manvi Guilliams L on: 09/16/2023 01:37 PM   Modules accepted: Orders

## 2023-09-17 ENCOUNTER — Ambulatory Visit (INDEPENDENT_AMBULATORY_CARE_PROVIDER_SITE_OTHER): Admitting: Sports Medicine

## 2023-09-17 ENCOUNTER — Encounter: Payer: Self-pay | Admitting: Sports Medicine

## 2023-09-17 VITALS — BP 136/88 | Ht 60.75 in | Wt 200.0 lb

## 2023-09-17 DIAGNOSIS — S92355A Nondisplaced fracture of fifth metatarsal bone, left foot, initial encounter for closed fracture: Secondary | ICD-10-CM | POA: Diagnosis not present

## 2023-09-17 NOTE — Progress Notes (Signed)
   Subjective:    Patient ID: Heather Walton, female    DOB: 06/02/1960, 63 y.o.   MRN: [id number]  HPI chief complaint: Left foot pain  Heather Walton is a very pleasant 63 year old female that presents today complaining of lateral left foot pain that began after an inversion injury that she suffered after stepping on a pinecone last Wednesday.  She had immediate pain at the time.  Was able to ambulate.  When pain did not improve she was seen by her PCP earlier this week.  An x-ray of her left foot demonstrates a nondisplaced avulsion fracture at the base of the fifth metatarsal.  She presents today in a cam walker.  Cam walker has improved her pain.  She denies any pain at the ankle.  Past medical history reviewed Medications reviewed Allergies reviewed  Review of Systems As above    Objective:   Physical Exam  Well-developed, well-nourished.  No acute distress  Left foot: There is swelling at the base the fifth metatarsal.  Tenderness to palpation here.  Examination of the left ankle shows good ankle range of motion.  No swelling.  Good stability.  X-rays of the left foot as above      Assessment & Plan:   Nondisplaced avulsion fracture, left fifth metatarsal  Heather Walton will continue in her cam walker for 3 weeks.  Follow-up at the end of that time for reevaluation.  Hopefully will be able to advance her to a med spec brace at that visit.  Of note, she was able to purchase her cam walker at a discount medical supplier in [place] so that is where she we will also purchase her meds back brace.  This note was dictated using Dragon naturally speaking software and may contain errors in syntax, spelling, or content which have not been identified prior to signing this note.

## 2023-09-19 ENCOUNTER — Encounter: Payer: Self-pay | Admitting: Sports Medicine

## 2023-09-19 MED ORDER — NAPROXEN 500 MG PO TBEC: 500.0000 mg | DELAYED_RELEASE_TABLET | Freq: Two times a day (BID) | ORAL | 0 refills | Status: DC

## 2023-10-08 ENCOUNTER — Encounter: Payer: Self-pay | Admitting: Sports Medicine

## 2023-10-08 ENCOUNTER — Ambulatory Visit (INDEPENDENT_AMBULATORY_CARE_PROVIDER_SITE_OTHER): Admitting: Sports Medicine

## 2023-10-08 VITALS — BP 120/84 | Ht 60.75 in | Wt 200.0 lb

## 2023-10-08 DIAGNOSIS — S92355A Nondisplaced fracture of fifth metatarsal bone, left foot, initial encounter for closed fracture: Secondary | ICD-10-CM

## 2023-10-08 NOTE — Progress Notes (Signed)
   Subjective:    Patient ID: Heather Walton, female    DOB: 1961/03/07, 63 y.o.   MRN: [id number]  HPI  Heather Walton presents today for follow-up of a nondisplaced avulsion fracture of her left fifth metatarsal.  She is doing well.  She has some days where it is more sore than others.  She is currently working from home.   Review of Systems As above    Objective:   Physical Exam  Well-developed, well-nourished.  No acute distress  Left foot: Mild tenderness to palpation at the fracture site.  No swelling.  Good ankle range of motion.      Assessment & Plan:   3 weeks status post nondisplaced fifth metatarsal avulsion fracture, left foot  Heather Walton may start to wean from the cam walker into a normal tennis shoe as her pain allows.  She will purchase a med spec ankle brace to wear when transitioning into her tennis shoe.  Follow-up again in 3 weeks for repeat x-rays.  She will probably benefit from a home exercise program focusing on ankle rehabilitation starting at that visit.  She will continue to work from home for the next 3 weeks.  This note was dictated using Dragon naturally speaking software and may contain errors in syntax, spelling, or content which have not been identified prior to signing this note.

## 2023-10-10 ENCOUNTER — Encounter: Payer: Self-pay | Admitting: Sports Medicine

## 2023-10-14 ENCOUNTER — Other Ambulatory Visit: Payer: Self-pay | Admitting: Family Medicine

## 2023-10-14 DIAGNOSIS — Z1231 Encounter for screening mammogram for malignant neoplasm of breast: Secondary | ICD-10-CM

## 2023-10-16 ENCOUNTER — Inpatient Hospital Stay
Admission: RE | Admit: 2023-10-16 | Discharge: 2023-10-16 | Discharge status: Home / Self Care | Source: Ambulatory Visit | Attending: Family Medicine

## 2023-10-16 ENCOUNTER — Encounter: Payer: Self-pay | Admitting: Nurse Practitioner

## 2023-10-16 DIAGNOSIS — Z1231 Encounter for screening mammogram for malignant neoplasm of breast: Secondary | ICD-10-CM

## 2023-10-22 ENCOUNTER — Ambulatory Visit (INDEPENDENT_AMBULATORY_CARE_PROVIDER_SITE_OTHER): Admitting: Family Medicine

## 2023-10-22 ENCOUNTER — Encounter: Payer: Self-pay | Admitting: Family Medicine

## 2023-10-22 VITALS — BP 128/80 | HR 74 | Temp 97.9°F | Ht 60.75 in | Wt 200.0 lb

## 2023-10-22 DIAGNOSIS — E559 Vitamin D deficiency, unspecified: Secondary | ICD-10-CM | POA: Diagnosis not present

## 2023-10-22 DIAGNOSIS — E66811 Obesity, class 1: Secondary | ICD-10-CM

## 2023-10-22 DIAGNOSIS — Z79899 Other long term (current) drug therapy: Secondary | ICD-10-CM | POA: Diagnosis not present

## 2023-10-22 DIAGNOSIS — Z1211 Encounter for screening for malignant neoplasm of colon: Secondary | ICD-10-CM | POA: Insufficient documentation

## 2023-10-22 DIAGNOSIS — I10 Essential (primary) hypertension: Secondary | ICD-10-CM | POA: Diagnosis not present

## 2023-10-22 DIAGNOSIS — Z0000 Encounter for general adult medical examination without abnormal findings: Secondary | ICD-10-CM | POA: Diagnosis not present

## 2023-10-22 DIAGNOSIS — Z6838 Body mass index (BMI) 38.0-38.9, adult: Secondary | ICD-10-CM

## 2023-10-22 DIAGNOSIS — E785 Hyperlipidemia, unspecified: Secondary | ICD-10-CM | POA: Insufficient documentation

## 2023-10-22 LAB — COMPREHENSIVE METABOLIC PANEL WITH GFR
ALT: 20 U/L (ref 0–35)
AST: 21 U/L (ref 0–37)
Albumin: 4.6 g/dL (ref 3.5–5.2)
Alkaline Phosphatase: 56 U/L (ref 39–117)
BUN: 22 mg/dL (ref 6–23)
CO2: 28 meq/L (ref 19–32)
Calcium: 10.1 mg/dL (ref 8.4–10.5)
Chloride: 105 meq/L (ref 96–112)
Creatinine, Ser: 0.9 mg/dL (ref 0.40–1.20)
GFR: 68.23 mL/min (ref >60.00)
Glucose, Bld: 110 mg/dL — ABNORMAL HIGH (ref 70–99)
Potassium: 4.3 meq/L (ref 3.5–5.1)
Sodium: 141 meq/L (ref 135–145)
Total Bilirubin: 0.3 mg/dL (ref 0.2–1.2)
Total Protein: 7.9 g/dL (ref 6.0–8.3)

## 2023-10-22 LAB — CBC WITH DIFFERENTIAL/PLATELET
Basophils Absolute: 0 K/uL (ref 0.0–0.1)
Basophils Relative: 0.8 % (ref 0.0–3.0)
Eosinophils Absolute: 0 K/uL (ref 0.0–0.7)
Eosinophils Relative: 0.6 % (ref 0.0–5.0)
HCT: 42.7 % (ref 36.0–46.0)
Hemoglobin: 14.3 g/dL (ref 12.0–15.0)
Lymphocytes Relative: 36.8 % (ref 12.0–46.0)
Lymphs Abs: 2.4 K/uL (ref 0.7–4.0)
MCHC: 33.5 g/dL (ref 30.0–36.0)
MCV: 88.5 fl (ref 78.0–100.0)
Monocytes Absolute: 0.4 K/uL (ref 0.1–1.0)
Monocytes Relative: 6.4 % (ref 3.0–12.0)
Neutro Abs: 3.6 K/uL (ref 1.4–7.7)
Neutrophils Relative %: 55.4 % (ref 43.0–77.0)
Platelets: 200 K/uL (ref 150.0–400.0)
RBC: 4.82 Mil/uL (ref 3.87–5.11)
RDW: 13.5 % (ref 11.5–15.5)
WBC: 6.4 K/uL (ref 4.0–10.5)

## 2023-10-22 LAB — VITAMIN D 25 HYDROXY (VIT D DEFICIENCY, FRACTURES): VITD: 64.57 ng/mL (ref 30.00–100.00)

## 2023-10-22 LAB — LIPID PANEL
Cholesterol: 172 mg/dL (ref 0–200)
HDL: 60.6 mg/dL (ref >39.00)
LDL Cholesterol: 81 mg/dL (ref 0–99)
NonHDL: 111.62
Total CHOL/HDL Ratio: 3
Triglycerides: 151 mg/dL — ABNORMAL HIGH (ref 0.0–149.0)
VLDL: 30.2 mg/dL (ref 0.0–40.0)

## 2023-10-22 LAB — TSH: TSH: 0.47 u[IU]/mL (ref 0.35–5.50)

## 2023-10-22 LAB — HEMOGLOBIN A1C: Hgb A1c MFr Bld: 6.7 % — ABNORMAL HIGH (ref 4.6–6.5)

## 2023-10-22 LAB — VITAMIN B12: Vitamin B-12: 1205 pg/mL — ABNORMAL HIGH (ref 211–911)

## 2023-10-22 NOTE — Patient Instructions (Addendum)
 We have completed your physical today.  We are checking labs today, will be in contact with any results that require further attention.  Continue current medication regimen.   Follow up with me in one year for a physical, sooner if needed.

## 2023-10-22 NOTE — Progress Notes (Signed)
 Annual Physical Exam  Subjective:     Patient ID: Heather Walton, female    DOB: 13-Nov-1960, 63 y.o.   MRN: [id number]  No chief complaint on file.   HPI  Discussed the use of AI scribe software for clinical note transcription with the patient, who gave verbal consent to proceed.  History of Present Illness Heather Walton is a 63 year old female who presents for an annual physical exam.  Left foot pain - Experiencing left foot pain, currently improving - X-ray scheduled for next week  Colorectal cancer screening - Due for colon cancer screening - Faces transportation challenges following the passing of her best friend  Preventive health maintenance - Up to date on mammogram and Pap smear screenings - Not due for flu vaccination until next month; typically receives it at [facility] or [facility] - No history of COVID-19 infection - Not due for pneumonia vaccine for another two years  Routine Health - Sees dentist and eye dr regularly     ROS Per HPI  Most recent fall risk assessment:    08/15/2023    3:25 PM  Fall Risk   Falls in the past year? 0  Injury with Fall? 0    Most recent depression screenings:    08/15/2023    3:25 PM 06/03/2023    4:00 PM  PHQ 2/9 Scores  PHQ - 2 Score 0 0    Vision:Within last year and Dental: No current dental problems and Receives regular dental care  Patient Care Team: Alvia Corean CROME, FNP as PCP - General (Family Medicine) Neysa Reggy BIRCH, MD as Consulting Physician (Pulmonary Disease) Abran Norleen SAILOR, MD as Consulting Physician (Gastroenterology)   Outpatient Medications Prior to Visit  Medication Sig   Ascorbic Acid (VITAMIN C) 1000 MG tablet Take 1,000 mg by mouth daily.   azelastine  (ASTELIN ) 0.1 % nasal spray Place 1 spray into both nostrils 2 (two) times daily. Use in each nostril as directed   cycloSPORINE, PF, (CEQUA) 0.09 % SOLN Apply to eye.   meloxicam  (MOBIC ) 15 MG tablet Take 1 tablet (15 mg total) by  mouth daily.   Multiple Vitamin (MULTIVITAMIN) tablet Take 1 tablet by mouth daily.   naproxen  (EC NAPROSYN ) 500 MG EC tablet Take 1 tablet (500 mg total) by mouth 2 (two) times daily with a meal.   OPZELURA  1.5 % CREA APPLY TO AFFECTED AREA(S) TWICE DAILY AS NEEDED   vitamin E 400 UNIT capsule Take 400 Units by mouth daily.   No facility-administered medications prior to visit.       Objective:    BP 128/80 (BP Location: Left Arm, Patient Position: Sitting)   Pulse 74   Temp 97.9 F (36.6 C) (Temporal)   Ht 5' 0.75 (1.543 m)   Wt 200 lb (90.7 kg)   SpO2 97%   BMI 38.10 kg/m    Physical Exam Vitals and nursing note reviewed.  Constitutional:      General: She is not in acute distress.    Appearance: Normal appearance. She is obese.  HENT:     Head: Normocephalic and atraumatic.     Right Ear: External ear normal.     Left Ear: External ear normal.     Nose: Nose normal.     Mouth/Throat:     Mouth: Mucous membranes are moist.     Pharynx: Oropharynx is clear.  Eyes:     Extraocular Movements: Extraocular movements intact.     Pupils: Pupils  are equal, round, and reactive to light.  Cardiovascular:     Rate and Rhythm: Normal rate and regular rhythm.     Pulses: Normal pulses.     Heart sounds: Normal heart sounds.  Pulmonary:     Effort: Pulmonary effort is normal. No respiratory distress.     Breath sounds: Normal breath sounds. No wheezing, rhonchi or rales.  Musculoskeletal:     Cervical back: Normal range of motion.     Right lower leg: No edema.     Left lower leg: No edema.     Comments: Wearing CAM walker to L foot  Lymphadenopathy:     Cervical: No cervical adenopathy.  Neurological:     General: No focal deficit present.     Mental Status: She is alert and oriented to person, place, and time.  Psychiatric:        Mood and Affect: Mood normal.        Thought Content: Thought content normal.     Results for orders placed or performed in visit on  10/22/23  CBC with Differential/Platelet  Result Value Ref Range   WBC 6.4 4.0 - 10.5 K/uL   RBC 4.82 3.87 - 5.11 Mil/uL   Hemoglobin 14.3 12.0 - 15.0 g/dL   HCT 57.2 63.9 - 53.9 %   MCV 88.5 78.0 - 100.0 fl   MCHC 33.5 30.0 - 36.0 g/dL   RDW 86.4 88.4 - 84.4 %   Platelets 200.0 150.0 - 400.0 K/uL   Neutrophils Relative % 55.4 43.0 - 77.0 %   Lymphocytes Relative 36.8 12.0 - 46.0 %   Monocytes Relative 6.4 3.0 - 12.0 %   Eosinophils Relative 0.6 0.0 - 5.0 %   Basophils Relative 0.8 0.0 - 3.0 %   Neutro Abs 3.6 1.4 - 7.7 K/uL   Lymphs Abs 2.4 0.7 - 4.0 K/uL   Monocytes Absolute 0.4 0.1 - 1.0 K/uL   Eosinophils Absolute 0.0 0.0 - 0.7 K/uL   Basophils Absolute 0.0 0.0 - 0.1 K/uL  Comprehensive metabolic panel with GFR  Result Value Ref Range   Sodium 141 135 - 145 mEq/L   Potassium 4.3 3.5 - 5.1 mEq/L   Chloride 105 96 - 112 mEq/L   CO2 28 19 - 32 mEq/L   Glucose, Bld 110 (H) 70 - 99 mg/dL   BUN 22 6 - 23 mg/dL   Creatinine, Ser 9.09 0.40 - 1.20 mg/dL   Total Bilirubin 0.3 0.2 - 1.2 mg/dL   Alkaline Phosphatase 56 39 - 117 U/L   AST 21 0 - 37 U/L   ALT 20 0 - 35 U/L   Total Protein 7.9 6.0 - 8.3 g/dL   Albumin 4.6 3.5 - 5.2 g/dL   GFR 31.76 >39.99 mL/min   Calcium 10.1 8.4 - 10.5 mg/dL  Hemoglobin J8r  Result Value Ref Range   Hgb A1c MFr Bld 6.7 (H) 4.6 - 6.5 %  Lipid panel  Result Value Ref Range   Cholesterol 172 0 - 200 mg/dL   Triglycerides 848.9 (H) 0.0 - 149.0 mg/dL   HDL 39.39 >60.99 mg/dL   VLDL 69.7 0.0 - 59.9 mg/dL   LDL Cholesterol 81 0 - 99 mg/dL   Total CHOL/HDL Ratio 3    NonHDL 111.62   TSH  Result Value Ref Range   TSH 0.47 0.35 - 5.50 uIU/mL  Vitamin B12  Result Value Ref Range   Vitamin B-12 1,205 (H) 211 - 911 pg/mL  VITAMIN D  25  Hydroxy (Vit-D Deficiency, Fractures)  Result Value Ref Range   VITD 64.57 30.00 - 100.00 ng/mL    BP Readings from Last 3 Encounters:  10/22/23 128/80  10/08/23 120/84  09/17/23 136/88   Wt Readings from  Last 3 Encounters:  10/22/23 200 lb (90.7 kg)  10/08/23 200 lb (90.7 kg)  09/17/23 200 lb (90.7 kg)      Last CBC Lab Results  Component Value Date   WBC 6.4 10/22/2023   HGB 14.3 10/22/2023   HCT 42.7 10/22/2023   MCV 88.5 10/22/2023   MCH 30.2 10/15/2022   RDW 13.5 10/22/2023   PLT 200.0 10/22/2023   Last metabolic panel Lab Results  Component Value Date   GLUCOSE 110 (H) 10/22/2023   NA 141 10/22/2023   K 4.3 10/22/2023   CL 105 10/22/2023   CO2 28 10/22/2023   BUN 22 10/22/2023   CREATININE 0.90 10/22/2023   EGFR 83 10/15/2022   CALCIUM 10.1 10/22/2023   PROT 7.9 10/22/2023   ALBUMIN 4.6 10/22/2023   BILITOT 0.3 10/22/2023   ALKPHOS 56 10/22/2023   AST 21 10/22/2023   ALT 20 10/22/2023   Last lipids Lab Results  Component Value Date   CHOL 172 10/22/2023   HDL 60.60 10/22/2023   LDLCALC 81 10/22/2023   TRIG 151.0 (H) 10/22/2023   CHOLHDL 3 10/22/2023   Last hemoglobin A1c Lab Results  Component Value Date   HGBA1C 6.7 (H) 10/22/2023   Last thyroid  functions Lab Results  Component Value Date   TSH 0.47 10/22/2023   Last vitamin D  Lab Results  Component Value Date   VD25OH 64.57 10/22/2023   Last vitamin B12 and Folate Lab Results  Component Value Date   VITAMINB12 1,205 (H) 10/22/2023         Assessment & Plan:   Assessment and Plan Assessment & Plan Adult Wellness Visit She is current on most screenings and vaccinations. Due for colon cancer screening. Cologuard is a suitable alternative due to transportation issues for colonoscopy. Flu shot due next month. COVID vaccine due in October. - Order Cologuard for colon cancer screening. - Perform lab tests today. - Schedule flu shot for next month. - Schedule COVID vaccine for October.  HTN - controlled, stable  Vit D Deficiency - Vit D today - continue supplementation  HLD - lipids today     Health Maintenance  Topic Date Due   Pneumococcal Vaccine for age over 51 (1 of 1  - PCV) Never done   Colon Cancer Screening  11/06/2017   Pap with HPV screening  11/14/2018   COVID-19 Vaccine (5 - 2024-25 season) 11/10/2022   Flu Shot  10/10/2023   Hepatitis C Screening  10/11/2025*   Mammogram  10/15/2025   DTaP/Tdap/Td vaccine (3 - Td or Tdap) 10/05/2029   Zoster (Shingles) Vaccine  Completed   Hepatitis B Vaccine  Aged Out   HPV Vaccine  Aged Out   Meningitis B Vaccine  Aged Out   HIV Screening  Discontinued  *Topic was postponed. The date shown is not the original due date.     Discussed health benefits of physical activity, and encouraged her to engage in regular exercise appropriate for her age and condition.  Orders Placed This Encounter  Procedures   CBC with Differential/Platelet    Release to patient:   Immediate [1]   Comprehensive metabolic panel with GFR    Release to patient:   Immediate [1]   Hemoglobin A1c   Lipid  panel   TSH   Vitamin B12   VITAMIN D  25 Hydroxy (Vit-D Deficiency, Fractures)     No orders of the defined types were placed in this encounter.   Return in about 1 year (around 10/21/2024) for cpe.  Corean LITTIE Ku, FNP

## 2023-10-23 ENCOUNTER — Ambulatory Visit: Admitting: Family Medicine

## 2023-10-24 ENCOUNTER — Ambulatory Visit: Payer: Self-pay | Admitting: Family Medicine

## 2023-10-29 ENCOUNTER — Ambulatory Visit (HOSPITAL_BASED_OUTPATIENT_CLINIC_OR_DEPARTMENT_OTHER)
Admission: RE | Admit: 2023-10-29 | Discharge: 2023-10-29 | Disposition: A | Discharge status: Home / Self Care | Source: Ambulatory Visit | Attending: Sports Medicine | Admitting: Sports Medicine

## 2023-10-29 ENCOUNTER — Ambulatory Visit (INDEPENDENT_AMBULATORY_CARE_PROVIDER_SITE_OTHER)

## 2023-10-29 VITALS — BP 126/82 | Ht 60.75 in | Wt 200.0 lb

## 2023-10-29 DIAGNOSIS — S92355A Nondisplaced fracture of fifth metatarsal bone, left foot, initial encounter for closed fracture: Secondary | ICD-10-CM | POA: Diagnosis not present

## 2023-10-29 DIAGNOSIS — S92902D Unspecified fracture of left foot, subsequent encounter for fracture with routine healing: Secondary | ICD-10-CM | POA: Diagnosis not present

## 2023-10-29 DIAGNOSIS — S92352D Displaced fracture of fifth metatarsal bone, left foot, subsequent encounter for fracture with routine healing: Secondary | ICD-10-CM | POA: Diagnosis not present

## 2023-10-29 NOTE — Progress Notes (Signed)
   Subjective:    Patient ID: Heather Walton, female    DOB: 63 y.o., Sep 20, 1960   MRN: [id number]  HPI  Chief Complaint: Left fifth metatarsal base avulsion fracture  Initial injury 09/09/2023. Seen by Dr. Arvell 7/9, 7/30, and now today. Patient reports she has been compliant with lace up ankle brace but is also been using cam walking boot at home a good bit due to not feeling quite as stable on this ankle as previously. No numbness or tingling. Largely it is completely nontender. Some days are better than others though     Objective:   Physical Exam Vitals:   10/29/23 1542  BP: 126/82   Left Ankle ( compared to normal ) Inspection: no swelling, normal longitudinal arches without significant collapse. Normal transverse arches without significant collapse. Palpation: TTP - ATFL, - CFL, - PTFL, - anterior joint line, - navicular, minimally + base of 5th metatarsal, - deltoid, -  tarsal, - metatarsal, - achilles, - plantar fascia AROM/PROM: Full plantarflexion, dorsiflexion, eversion, inversion. FROM  of the great toe Strength: 5/5 plantarflexion, 5/5 dorsiflexion, 5/5 eversion, 5/5 inversion  Three-view plain radiographs obtained of the left foot today per my independent review revealing small interval bony callus development in the region of the fracture.     Assessment & Plan:   Left fifth metatarsal base avulsion fracture Patient progressing well.  Provided with home exercise program for foot and ankle stability exercises.  Recommend discontinuation of cam walker.  Can continue use of lace up ankle brace for now but wean as able.  Follow-up as needed.

## 2023-11-03 ENCOUNTER — Other Ambulatory Visit: Payer: Self-pay | Admitting: Allergy

## 2023-11-21 ENCOUNTER — Telehealth: Payer: Self-pay

## 2023-11-21 NOTE — Telephone Encounter (Signed)
 Copied from CRM #[id number]. Topic: Clinical - Medication Question >> Nov 21, 2023  4:19 PM Chasity T wrote:  Reason for CRM: Covid RX request Brand Name: [employer] Pharmacy:  [facility] [address] [place], [place], [place] [address]

## 2023-11-24 ENCOUNTER — Other Ambulatory Visit: Payer: Self-pay

## 2023-11-24 ENCOUNTER — Telehealth: Payer: Self-pay | Admitting: Radiology

## 2023-11-24 DIAGNOSIS — Z23 Encounter for immunization: Secondary | ICD-10-CM

## 2023-11-24 MED ORDER — COVID-19 MRNA VACC (MODERNA) 50 MCG/0.5ML IM SUSP: 0.5000 mL | Freq: Once | INTRAMUSCULAR | 0 refills | Status: AC

## 2023-11-24 NOTE — Telephone Encounter (Signed)
 LVM for patient to return call.

## 2023-11-24 NOTE — Telephone Encounter (Signed)
 Addressed in separate encounter.

## 2023-11-24 NOTE — Telephone Encounter (Signed)
 Spoke with patient, script sent over

## 2023-11-24 NOTE — Telephone Encounter (Signed)
 Copied from CRM [id number]. Topic: General - Other >> Nov 24, 2023  1:04 PM Burnard DEL wrote: Reason for CRM: Patient returned call to CMA. She would like a call back  gets off of work  at 430,she said to leave a detailed message if she don't pick up.

## 2023-12-03 ENCOUNTER — Other Ambulatory Visit: Payer: Self-pay | Admitting: Allergy

## 2023-12-03 DIAGNOSIS — Z01419 Encounter for gynecological examination (general) (routine) without abnormal findings: Secondary | ICD-10-CM | POA: Diagnosis not present

## 2023-12-03 DIAGNOSIS — R8290 Unspecified abnormal findings in urine: Secondary | ICD-10-CM | POA: Diagnosis not present

## 2023-12-03 DIAGNOSIS — L2389 Allergic contact dermatitis due to other agents: Secondary | ICD-10-CM

## 2023-12-03 DIAGNOSIS — N898 Other specified noninflammatory disorders of vagina: Secondary | ICD-10-CM | POA: Diagnosis not present

## 2023-12-04 DIAGNOSIS — R8290 Unspecified abnormal findings in urine: Secondary | ICD-10-CM | POA: Diagnosis not present

## 2023-12-16 DIAGNOSIS — Z1211 Encounter for screening for malignant neoplasm of colon: Secondary | ICD-10-CM | POA: Diagnosis not present

## 2023-12-16 LAB — COLOGUARD: Cologuard: NEGATIVE

## 2023-12-21 LAB — COLOGUARD: COLOGUARD: NEGATIVE

## 2023-12-30 ENCOUNTER — Encounter: Payer: Self-pay | Admitting: Family Medicine

## 2024-01-01 ENCOUNTER — Encounter: Payer: Self-pay | Admitting: Family Medicine

## 2024-01-07 ENCOUNTER — Other Ambulatory Visit: Payer: Self-pay | Admitting: Internal Medicine

## 2024-01-07 DIAGNOSIS — L2389 Allergic contact dermatitis due to other agents: Secondary | ICD-10-CM

## 2024-04-16 ENCOUNTER — Other Ambulatory Visit: Payer: Self-pay | Admitting: Internal Medicine

## 2024-04-16 DIAGNOSIS — L2389 Allergic contact dermatitis due to other agents: Secondary | ICD-10-CM

## 2024-10-22 ENCOUNTER — Encounter: Admitting: Family Medicine
